# Patient Record
Sex: Female | Born: 1980 | Race: White | Hispanic: No | Marital: Married | Smoking: Never smoker
Health system: Southern US, Community
[De-identification: ages and names within clinical notes are randomized; demographics above are authoritative.]

## PROBLEM LIST (undated history)

## (undated) DIAGNOSIS — D649 Anemia, unspecified: Secondary | ICD-10-CM

## (undated) DIAGNOSIS — I10 Essential (primary) hypertension: Secondary | ICD-10-CM

## (undated) DIAGNOSIS — I471 Supraventricular tachycardia, unspecified: Secondary | ICD-10-CM

## (undated) DIAGNOSIS — M549 Dorsalgia, unspecified: Secondary | ICD-10-CM

## (undated) DIAGNOSIS — D693 Immune thrombocytopenic purpura: Principal | ICD-10-CM

## (undated) DIAGNOSIS — Z9889 Other specified postprocedural states: Secondary | ICD-10-CM

## (undated) DIAGNOSIS — R51 Headache: Secondary | ICD-10-CM

## (undated) DIAGNOSIS — R112 Nausea with vomiting, unspecified: Secondary | ICD-10-CM

## (undated) DIAGNOSIS — O99113 Other diseases of the blood and blood-forming organs and certain disorders involving the immune mechanism complicating pregnancy, third trimester: Principal | ICD-10-CM

## (undated) DIAGNOSIS — G709 Myoneural disorder, unspecified: Secondary | ICD-10-CM

## (undated) HISTORY — DX: Immune thrombocytopenic purpura: D69.3

## (undated) HISTORY — DX: Other diseases of the blood and blood-forming organs and certain disorders involving the immune mechanism complicating pregnancy, third trimester: O99.113

## (undated) HISTORY — PX: WISDOM TOOTH EXTRACTION: SHX21

## (undated) HISTORY — DX: Myoneural disorder, unspecified: G70.9

## (undated) HISTORY — DX: Dorsalgia, unspecified: M54.9

---

## 2012-05-12 LAB — OB RESULTS CONSOLE RUBELLA ANTIBODY, IGM: Rubella: IMMUNE

## 2012-05-12 LAB — OB RESULTS CONSOLE ABO/RH

## 2012-05-12 LAB — OB RESULTS CONSOLE ANTIBODY SCREEN: Antibody Screen: NEGATIVE

## 2012-08-11 ENCOUNTER — Encounter (HOSPITAL_COMMUNITY): Payer: Self-pay

## 2012-08-19 ENCOUNTER — Encounter (HOSPITAL_COMMUNITY): Payer: Self-pay

## 2012-08-20 ENCOUNTER — Encounter (HOSPITAL_COMMUNITY): Payer: Self-pay

## 2012-08-20 ENCOUNTER — Inpatient Hospital Stay (HOSPITAL_COMMUNITY): Admission: RE | Admit: 2012-08-20 | Payer: Medicaid Other | Source: Ambulatory Visit

## 2012-08-21 ENCOUNTER — Encounter (HOSPITAL_COMMUNITY): Payer: Self-pay

## 2012-08-21 ENCOUNTER — Encounter (HOSPITAL_COMMUNITY)
Admission: RE | Admit: 2012-08-21 | Discharge: 2012-08-21 | Disposition: A | Discharge status: Home / Self Care | Payer: Medicaid Other | Source: Ambulatory Visit | Attending: Obstetrics & Gynecology | Admitting: Obstetrics & Gynecology

## 2012-08-21 HISTORY — DX: Headache: R51

## 2012-08-21 HISTORY — DX: Nausea with vomiting, unspecified: R11.2

## 2012-08-21 HISTORY — DX: Anemia, unspecified: D64.9

## 2012-08-21 HISTORY — DX: Other specified postprocedural states: Z98.890

## 2012-08-21 LAB — RPR: RPR Ser Ql: NONREACTIVE

## 2012-08-21 LAB — CBC
Hemoglobin: 14.2 g/dL (ref 12.0–15.0)
MCHC: 34.2 g/dL (ref 30.0–36.0)
WBC: 8.5 10*3/uL (ref 4.0–10.5)

## 2012-08-21 NOTE — Pre-Procedure Instructions (Signed)
Used Jarome Matin from [employer] for translation during [employer].

## 2012-08-21 NOTE — Patient Instructions (Addendum)
   Your procedure is scheduled on:  Monday, Feb 24  Enter through the Main Entrance of [facility] at: 10 am Pick up the phone at the desk and dial [phone] and inform us of your arrival.  Please call this number if you have any problems the morning of surgery: [phone]  Remember: Do not eat food after midnight: Sunday Do not drink clear liquids after: midnight Sunday Take these medicines the morning of surgery with a SIP OF WATER:  None  Do not wear jewelry, make-up, or FINGER nail polish No metal in your hair or on your body. Do not wear lotions, powders, perfumes. You may wear deodorant.  Please use your CHG wash as directed prior to surgery.  Do not shave anywhere for at least 12 hours prior to first CHG shower.  Do not bring valuables to the hospital. Contacts, dentures or bridgework may not be worn into surgery.  Leave suitcase in the car. After Surgery it may be brought to your room. For patients being admitted to the hospital, checkout time is 11:00am the day of discharge.  Home with husband Tarik.

## 2012-08-21 NOTE — Pre-Procedure Instructions (Signed)
Dr Rodman Pickle informed patient's platelets today were 88.  It's noted on the OB Prenatal forms that patient's platelets at 24 weeks were 118.  Per Dr Rodman Pickle, repeat stat cbc on DOS and Type and Crossmatch 2 units stat DOS.  Verified verbal order.

## 2012-08-24 ENCOUNTER — Encounter (HOSPITAL_COMMUNITY)
Admission: AD | Disposition: A | Discharge status: Home / Self Care | Payer: Self-pay | Source: Ambulatory Visit | Attending: Obstetrics & Gynecology

## 2012-08-24 ENCOUNTER — Inpatient Hospital Stay (HOSPITAL_COMMUNITY): Payer: Medicaid Other

## 2012-08-24 ENCOUNTER — Encounter (HOSPITAL_COMMUNITY): Payer: Self-pay

## 2012-08-24 ENCOUNTER — Encounter (HOSPITAL_COMMUNITY): Payer: Self-pay | Admitting: Anesthesiology

## 2012-08-24 ENCOUNTER — Inpatient Hospital Stay (HOSPITAL_COMMUNITY)
Admission: AD | Admit: 2012-08-24 | Discharge: 2012-08-27 | DRG: 766 | Disposition: A | Discharge status: Home / Self Care | Payer: Medicaid Other | Source: Ambulatory Visit | Attending: Obstetrics & Gynecology | Admitting: Obstetrics & Gynecology

## 2012-08-24 ENCOUNTER — Inpatient Hospital Stay (HOSPITAL_COMMUNITY): Payer: Medicaid Other | Admitting: Anesthesiology

## 2012-08-24 DIAGNOSIS — O364XX0 Maternal care for intrauterine death, not applicable or unspecified: Principal | ICD-10-CM | POA: Diagnosis present

## 2012-08-24 DIAGNOSIS — O34219 Maternal care for unspecified type scar from previous cesarean delivery: Secondary | ICD-10-CM | POA: Diagnosis present

## 2012-08-24 LAB — PREPARE RBC (CROSSMATCH)

## 2012-08-24 LAB — CBC
MCH: 29.3 pg (ref 26.0–34.0)
MCV: 87.8 fL (ref 78.0–100.0)
Platelets: 88 10*3/uL — ABNORMAL LOW (ref 150–400)
RBC: 4.58 MIL/uL (ref 3.87–5.11)
RDW: 14.5 % (ref 11.5–15.5)
WBC: 9.9 10*3/uL (ref 4.0–10.5)

## 2012-08-24 SURGERY — Surgical Case
Anesthesia: Spinal | Site: Abdomen | Wound class: Clean Contaminated

## 2012-08-24 MED ORDER — PRENATAL MULTIVITAMIN CH
1.0000 | ORAL_TABLET | Freq: Every day | ORAL | Status: DC
  Administered 2012-08-25 – 2012-08-27 (×3): 1 via ORAL
  Filled 2012-08-24 (×3): qty 1

## 2012-08-24 MED ORDER — ONDANSETRON HCL 4 MG/2ML IJ SOLN: 4.0000 mg | Freq: Once | INTRAMUSCULAR | Status: DC

## 2012-08-24 MED ORDER — NALOXONE HCL 1 MG/ML IJ SOLN: 1.0000 ug/kg/h | INTRAVENOUS | Status: DC | PRN

## 2012-08-24 MED ORDER — DIPHENHYDRAMINE HCL 25 MG PO CAPS: 25.0000 mg | ORAL_CAPSULE | Freq: Four times a day (QID) | ORAL | Status: DC | PRN

## 2012-08-24 MED ORDER — ONDANSETRON HCL 4 MG/2ML IJ SOLN: 4.0000 mg | Freq: Three times a day (TID) | INTRAMUSCULAR | Status: DC | PRN

## 2012-08-24 MED ORDER — IBUPROFEN 600 MG PO TABS: 600.0000 mg | ORAL_TABLET | Freq: Four times a day (QID) | ORAL | Status: DC

## 2012-08-24 MED ORDER — ONDANSETRON HCL 4 MG PO TABS: 4.0000 mg | ORAL_TABLET | ORAL | Status: DC | PRN

## 2012-08-24 MED ORDER — ONDANSETRON HCL 4 MG/2ML IJ SOLN
INTRAMUSCULAR | Status: AC
  Filled 2012-08-24: qty 2

## 2012-08-24 MED ORDER — ONDANSETRON HCL 4 MG/2ML IJ SOLN: 4.0000 mg | INTRAMUSCULAR | Status: DC | PRN

## 2012-08-24 MED ORDER — SIMETHICONE 80 MG PO CHEW
80.0000 mg | CHEWABLE_TABLET | Freq: Three times a day (TID) | ORAL | Status: DC
  Administered 2012-08-24 – 2012-08-27 (×8): 80 mg via ORAL

## 2012-08-24 MED ORDER — METOCLOPRAMIDE HCL 5 MG/ML IJ SOLN: 10.0000 mg | Freq: Three times a day (TID) | INTRAMUSCULAR | Status: DC | PRN

## 2012-08-24 MED ORDER — ACETAMINOPHEN 10 MG/ML IV SOLN
1000.0000 mg | Freq: Four times a day (QID) | INTRAVENOUS | Status: AC | PRN
  Administered 2012-08-24 – 2012-08-25 (×2): 1000 mg via INTRAVENOUS
  Filled 2012-08-24 (×2): qty 100

## 2012-08-24 MED ORDER — PHENYLEPHRINE 40 MCG/ML (10ML) SYRINGE FOR IV PUSH (FOR BLOOD PRESSURE SUPPORT)
PREFILLED_SYRINGE | INTRAVENOUS | Status: AC
  Filled 2012-08-24: qty 15

## 2012-08-24 MED ORDER — FENTANYL CITRATE 0.05 MG/ML IJ SOLN
INTRAMUSCULAR | Status: AC
  Filled 2012-08-24: qty 2

## 2012-08-24 MED ORDER — TETANUS-DIPHTH-ACELL PERTUSSIS 5-2.5-18.5 LF-MCG/0.5 IM SUSP: 0.5000 mL | Freq: Once | INTRAMUSCULAR | Status: DC

## 2012-08-24 MED ORDER — SCOPOLAMINE 1 MG/3DAYS TD PT72: 1.0000 | MEDICATED_PATCH | Freq: Once | TRANSDERMAL | Status: DC

## 2012-08-24 MED ORDER — DIPHENHYDRAMINE HCL 50 MG/ML IJ SOLN: 12.5000 mg | INTRAMUSCULAR | Status: DC | PRN

## 2012-08-24 MED ORDER — OXYTOCIN 10 UNIT/ML IJ SOLN
INTRAMUSCULAR | Status: AC
  Filled 2012-08-24: qty 4

## 2012-08-24 MED ORDER — DIBUCAINE 1 % RE OINT: 1.0000 "application " | TOPICAL_OINTMENT | RECTAL | Status: DC | PRN

## 2012-08-24 MED ORDER — SCOPOLAMINE 1 MG/3DAYS TD PT72
MEDICATED_PATCH | TRANSDERMAL | Status: AC
  Administered 2012-08-24: 1.5 mg
  Filled 2012-08-24: qty 1

## 2012-08-24 MED ORDER — SENNOSIDES-DOCUSATE SODIUM 8.6-50 MG PO TABS
2.0000 | ORAL_TABLET | Freq: Every day | ORAL | Status: DC
  Administered 2012-08-24 – 2012-08-26 (×3): 2 via ORAL

## 2012-08-24 MED ORDER — LACTATED RINGERS IV SOLN
INTRAVENOUS | Status: DC
  Administered 2012-08-24 – 2012-08-25 (×2): via INTRAVENOUS

## 2012-08-24 MED ORDER — OXYTOCIN 10 UNIT/ML IJ SOLN
40.0000 [IU] | INTRAVENOUS | Status: DC | PRN
  Administered 2012-08-24: 40 [IU] via INTRAVENOUS

## 2012-08-24 MED ORDER — OXYTOCIN 40 UNITS IN LACTATED RINGERS INFUSION - SIMPLE MED: 62.5000 mL/h | INTRAVENOUS | Status: AC

## 2012-08-24 MED ORDER — SODIUM CHLORIDE 0.9 % IJ SOLN: 3.0000 mL | INTRAMUSCULAR | Status: DC | PRN

## 2012-08-24 MED ORDER — LACTATED RINGERS IV SOLN
INTRAVENOUS | Status: DC
  Administered 2012-08-24 (×3): via INTRAVENOUS

## 2012-08-24 MED ORDER — MENTHOL 3 MG MT LOZG
1.0000 | LOZENGE | OROMUCOSAL | Status: DC | PRN
  Filled 2012-08-24: qty 9

## 2012-08-24 MED ORDER — SIMETHICONE 80 MG PO CHEW: 80.0000 mg | CHEWABLE_TABLET | ORAL | Status: DC | PRN

## 2012-08-24 MED ORDER — CEFAZOLIN SODIUM-DEXTROSE 2-3 GM-% IV SOLR
2.0000 g | INTRAVENOUS | Status: AC
  Administered 2012-08-24: 2 g via INTRAVENOUS

## 2012-08-24 MED ORDER — LANOLIN HYDROUS EX OINT: 1.0000 "application " | TOPICAL_OINTMENT | CUTANEOUS | Status: DC | PRN

## 2012-08-24 MED ORDER — ZOLPIDEM TARTRATE 5 MG PO TABS
5.0000 mg | ORAL_TABLET | Freq: Every evening | ORAL | Status: DC | PRN
  Administered 2012-08-25 – 2012-08-26 (×2): 5 mg via ORAL
  Filled 2012-08-24 (×2): qty 1

## 2012-08-24 MED ORDER — MORPHINE SULFATE 0.5 MG/ML IJ SOLN
INTRAMUSCULAR | Status: AC
  Filled 2012-08-24: qty 10

## 2012-08-24 MED ORDER — PROMETHAZINE HCL 25 MG/ML IJ SOLN: 6.2500 mg | INTRAMUSCULAR | Status: DC | PRN

## 2012-08-24 MED ORDER — NALBUPHINE HCL 10 MG/ML IJ SOLN: 5.0000 mg | INTRAMUSCULAR | Status: DC | PRN

## 2012-08-24 MED ORDER — MEPERIDINE HCL 25 MG/ML IJ SOLN: 6.2500 mg | INTRAMUSCULAR | Status: DC | PRN

## 2012-08-24 MED ORDER — INFLUENZA VIRUS VACC SPLIT PF IM SUSP: 0.5000 mL | INTRAMUSCULAR | Status: AC

## 2012-08-24 MED ORDER — BUPIVACAINE IN DEXTROSE 0.75-8.25 % IT SOLN
INTRATHECAL | Status: DC | PRN
  Administered 2012-08-24: 1.6 mL via INTRATHECAL

## 2012-08-24 MED ORDER — OXYCODONE-ACETAMINOPHEN 5-325 MG PO TABS
1.0000 | ORAL_TABLET | ORAL | Status: DC | PRN
  Administered 2012-08-25 – 2012-08-27 (×11): 2 via ORAL
  Filled 2012-08-24 (×11): qty 2

## 2012-08-24 MED ORDER — FENTANYL CITRATE 0.05 MG/ML IJ SOLN
INTRAMUSCULAR | Status: DC | PRN
  Administered 2012-08-24: 25 ug via INTRATHECAL

## 2012-08-24 MED ORDER — DIPHENHYDRAMINE HCL 25 MG PO CAPS: 25.0000 mg | ORAL_CAPSULE | ORAL | Status: DC | PRN

## 2012-08-24 MED ORDER — FENTANYL CITRATE 0.05 MG/ML IJ SOLN: 25.0000 ug | INTRAMUSCULAR | Status: DC | PRN

## 2012-08-24 MED ORDER — MIDAZOLAM HCL 2 MG/2ML IJ SOLN: 0.5000 mg | Freq: Once | INTRAMUSCULAR | Status: AC | PRN

## 2012-08-24 MED ORDER — CEFAZOLIN SODIUM-DEXTROSE 2-3 GM-% IV SOLR
INTRAVENOUS | Status: AC
  Filled 2012-08-24: qty 50

## 2012-08-24 MED ORDER — PHENYLEPHRINE HCL 10 MG/ML IJ SOLN
INTRAMUSCULAR | Status: DC | PRN
  Administered 2012-08-24: 40 ug via INTRAVENOUS
  Administered 2012-08-24 (×3): 80 ug via INTRAVENOUS

## 2012-08-24 MED ORDER — DIPHENHYDRAMINE HCL 50 MG/ML IJ SOLN: 25.0000 mg | INTRAMUSCULAR | Status: DC | PRN

## 2012-08-24 MED ORDER — MORPHINE SULFATE (PF) 0.5 MG/ML IJ SOLN
INTRAMUSCULAR | Status: DC | PRN
  Administered 2012-08-24: .1 mg via INTRATHECAL

## 2012-08-24 MED ORDER — WITCH HAZEL-GLYCERIN EX PADS: 1.0000 "application " | MEDICATED_PAD | CUTANEOUS | Status: DC | PRN

## 2012-08-24 MED ORDER — NALOXONE HCL 0.4 MG/ML IJ SOLN: 0.4000 mg | INTRAMUSCULAR | Status: DC | PRN

## 2012-08-24 SURGICAL SUPPLY — 25 items
CLOTH BEACON ORANGE TIMEOUT ST (SAFETY) ×2 IMPLANT
DRAPE LG THREE QUARTER DISP (DRAPES) ×2 IMPLANT
DRSG OPSITE POSTOP 4X10 (GAUZE/BANDAGES/DRESSINGS) ×2 IMPLANT
DURAPREP 26ML APPLICATOR (WOUND CARE) ×2 IMPLANT
ELECT REM PT RETURN 9FT ADLT (ELECTROSURGICAL) ×2
ELECTRODE REM PT RTRN 9FT ADLT (ELECTROSURGICAL) ×1 IMPLANT
GLOVE BIOGEL PI IND STRL 7.0 (GLOVE) ×2 IMPLANT
GLOVE BIOGEL PI INDICATOR 7.0 (GLOVE) ×2
GLOVE ECLIPSE 6.0 STRL STRAW (GLOVE) ×2 IMPLANT
GLOVE ECLIPSE 6.5 STRL STRAW (GLOVE) ×2 IMPLANT
GOWN PREVENTION PLUS LG XLONG (DISPOSABLE) ×6 IMPLANT
NEEDLE HYPO 25X5/8 SAFETYGLIDE (NEEDLE) ×2 IMPLANT
NS IRRIG 1000ML POUR BTL (IV SOLUTION) ×2 IMPLANT
PACK C SECTION WH (CUSTOM PROCEDURE TRAY) ×2 IMPLANT
PAD OB MATERNITY 4.3X12.25 (PERSONAL CARE ITEMS) ×2 IMPLANT
RTRCTR C-SECT PINK 25CM LRG (MISCELLANEOUS) ×2 IMPLANT
STAPLER VISISTAT 35W (STAPLE) ×2 IMPLANT
SUT VIC AB 0 CT1 27 (SUTURE) ×3
SUT VIC AB 0 CT1 27XBRD ANBCTR (SUTURE) ×3 IMPLANT
SUT VIC AB 1 CTX 36 (SUTURE) ×2
SUT VIC AB 1 CTX36XBRD ANBCTRL (SUTURE) ×2 IMPLANT
SUT VIC AB 3-0 CT1 27 (SUTURE) ×1
SUT VIC AB 3-0 CT1 TAPERPNT 27 (SUTURE) ×1 IMPLANT
TOWEL OR 17X24 6PK STRL BLUE (TOWEL DISPOSABLE) ×6 IMPLANT
TRAY FOLEY CATH 14FR (SET/KITS/TRAYS/PACK) ×2 IMPLANT

## 2012-08-24 NOTE — Op Note (Addendum)
Patient Name: Ebony [name] MRN: [id number]  Date of Surgery: 08/24/2012    PREOPERATIVE DIAGNOSIS: Repeat cesarean, Fetal Demise  POSTOPERATIVE DIAGNOSIS: Repeat; Fetal Demise   PROCEDURE: Low transverse cesarean section  SURGEON: Caralyn Guile. Arlyce Dice M.D.  ASSISTANT: Philip Aspen, D.O.  ANESTHESIA: Spinal  ESTIMATED BLOOD LOSS: 800 ml  FINDINGS: Stillborn female, 8 lbs 10 oz.  Thick meconium staining.  Single loose nuchal cord.   INDICATIONS: This is a 32 y.o.  [place] 1 who is admitted for Repeat cesarean delivery.  In pre op she was found to have a fetal demise confirmed by ultrasound.  Patient declined TOLAC.  PROCEDURE IN DETAIL: The patient was taken to the operating room and spinal anesthesia was placed.  She was then placed in the supine position with left lateral displacement of the uterus. The abdomen was prepped and draped in a sterile fashion and the bladder was catheterized.  A low transverse abdominal incision was made and carried down to the fascia. The fascia was opened transversely and the rectus sheath was dissected from the underlying rectus muscle. The rectus midline was identified and opened by sharp and blunt dissection. The peritoneum was opened. An Alexis retractor was placed and the lower uterine segment was identified, entered transversely by careful sharp dissection, and extended bluntly.  The infant was delivered without difficulty. The placenta was sent to pathology. The uterus was bluntly curettage. The lower segment was closed with running interlocking Vicryl 1 suture. The peritoneum and rectus muscle were closed in the midline with running 3-0 Vicryl suture. The fascia was closed with running 0 Vicryl suture and the skin was closed with staples. All sponge and instrument counts were correct.  The patient tolerated the procedure well and left the operating room in good condition.

## 2012-08-24 NOTE — Progress Notes (Signed)
I have interviewed and performed the pertinent exams on my patient.  Unfortunately, she has had a fetal demise.  This was confirmed with ultrasound.  After allowing enough time for the parents to absorb this sad news, they were offered TOLAC.  They decided to proceed with repeat cesarean due to the small but real risk of uterine rupture.

## 2012-08-24 NOTE — Anesthesia Preprocedure Evaluation (Addendum)
Anesthesia Evaluation  Patient identified by MRN, date of birth, ID band Patient awake    Reviewed: Allergy & Precautions, H&P , NPO status , Patient's Chart, lab work & pertinent test results  History of Anesthesia Complications (+) PONV  Airway Mallampati: II      Dental no notable dental hx.    Pulmonary neg pulmonary ROS,  breath sounds clear to auscultation  Pulmonary exam normal       Cardiovascular Exercise Tolerance: Good negative cardio ROS  Rhythm:regular Rate:Normal     Neuro/Psych  Headaches, negative neurological ROS  negative psych ROS   GI/Hepatic negative GI ROS, Neg liver ROS,   Endo/Other  negative endocrine ROS  Renal/GU negative Renal ROS  negative genitourinary   Musculoskeletal   Abdominal Normal abdominal exam  (+)   Peds  Hematology negative hematology ROS (+) anemia ,   Anesthesia Other Findings PONV (postoperative nausea and vomiting)     Anemia   history    Headache   otc med prn    Reproductive/Obstetrics (+) Pregnancy                           Anesthesia Physical Anesthesia Plan  ASA: II  Anesthesia Plan: Spinal   Post-op Pain Management:    Induction:   Airway Management Planned:   Additional Equipment:   Intra-op Plan:   Post-operative Plan:   Informed Consent: I have reviewed the patients History and Physical, chart, labs and discussed the procedure including the risks, benefits and alternatives for the proposed anesthesia with the patient or authorized representative who has indicated his/her understanding and acceptance.     Plan Discussed with: Anesthesiologist, CRNA and Surgeon  Anesthesia Plan Comments:        low platelets.  Will recheck. Used interpretor Anesthesia Quick Evaluation

## 2012-08-24 NOTE — Transfer of Care (Signed)
Immediate Anesthesia Transfer of Care Note  Patient: [name]  Procedure(s) Performed: Procedure(s): CESAREAN SECTION (N/A)  Patient Location: PACU  Anesthesia Type:Spinal  Level of Consciousness: awake, alert  and oriented  Airway & Oxygen Therapy: Patient Spontanous Breathing  Post-op Assessment: Report given to PACU RN and Post -op Vital signs reviewed and stable  Post vital signs: stable  Complications: No apparent anesthesia complications

## 2012-08-24 NOTE — Anesthesia Postprocedure Evaluation (Signed)
  Anesthesia Post Note  Patient: Ebony [name]  Procedure(s) Performed: Procedure(s) (LRB): CESAREAN SECTION (N/A)  Anesthesia type: Spinal  Patient location: PACU  Post pain: Pain level controlled  Post assessment: Post-op Vital signs reviewed  Last Vitals:  Filed Vitals:   08/24/12 1552  BP: 112/58  Pulse: 76  Temp: 36.8 C  Resp: 16    Post vital signs: Reviewed  Level of consciousness: awake  Complications: No apparent anesthesia complications

## 2012-08-24 NOTE — Anesthesia Procedure Notes (Signed)
Spinal  Patient location during procedure: OR Start time: 08/24/2012 1:39 PM Staffing Anesthesiologist: Angus Seller., Harrell Gave. Performed by: anesthesiologist  Preanesthetic Checklist Completed: patient identified, site marked, surgical consent, pre-op evaluation, timeout performed, IV checked, risks and benefits discussed and monitors and equipment checked Spinal Block Patient position: sitting Prep: DuraPrep Patient monitoring: heart rate, cardiac monitor, continuous pulse ox and blood pressure Approach: midline Location: L3-4 Injection technique: single-shot Needle Needle type: Sprotte  Needle gauge: 24 G Needle length: 9 cm Assessment Sensory level: T4 Additional Notes Patient identified.  Risk benefits discussed including failed block, incomplete pain control, headache, nerve damage, paralysis, blood pressure changes, nausea, vomiting, reactions to medication both toxic or allergic, and postpartum back pain.  Patient expressed understanding and wished to proceed.  All questions were answered.  Sterile technique used throughout procedure.  CSF was clear.  No parasthesia or other complications.  Please see nursing notes for vital signs.  first attempt.  No redirection needed.

## 2012-08-24 NOTE — Progress Notes (Signed)
Unable to hear fetal heartones via doppler.. Dr. Arlyce Dice aware and on way to check.

## 2012-08-24 NOTE — Progress Notes (Signed)
Patient resting comfortably.  Sat with parents for 15 minutes to discuss possible reasons and to reassure them that it was not due to anything she had done (they asked me if drinking coffee, lifting, anxiety, etc may have caused the demise).  I explained that most likely we would never find a cause other than unexplained cardiac arrest.  I have ordered a hemoglobin A1c and a lupus anticoagulant panel for the morning.  We will await these results and the pathology on the placenta to see if any etiologies may be ascertained.

## 2012-08-25 ENCOUNTER — Encounter (HOSPITAL_COMMUNITY): Payer: Self-pay | Admitting: Obstetrics & Gynecology

## 2012-08-25 LAB — TYPE AND SCREEN
ABO/RH(D): O POS
Unit division: 0

## 2012-08-25 LAB — CBC
Hemoglobin: 12.6 g/dL (ref 12.0–15.0)
MCH: 29.3 pg (ref 26.0–34.0)
RBC: 4.3 MIL/uL (ref 3.87–5.11)

## 2012-08-25 LAB — HEMOGLOBIN A1C
Hgb A1c MFr Bld: 5.6 % (ref <5.7)
Mean Plasma Glucose: 114 mg/dL (ref <117)

## 2012-08-25 NOTE — H&P (Signed)
32 y.o. G2P1  Estimated Date of Delivery: 08/30/12 admitted at 39/[redacted] weeks gestation with fetal demise.  Patient presented to pre-op area for scheduled repeat cesarean delivery.  Routine pre-op assessment of fetus revealed no fetal heart tones.  Bedside ultrasound confirmed fetal demise.  Patient states that after her routine office visit on Feb. 21, she did not note fetal movement since Feb. 22.  Prenatal Transfer Tool  Maternal Diabetes: No Genetic Screening: Not done due to late prenatal care - first visit 24 weeks. Maternal Ultrasounds/Referrals: Normal Fetal Ultrasounds or other Referrals:  None Maternal Substance Abuse:  No Significant Maternal Medications:  None Significant Maternal Lab Results: None Other Significant Pregnancy Complications:  None until today.  Afebrile, VSS Heart and Lungs: No active disease Abdomen: soft, gravid, EFW AGA. Cervical exam:  1/50.  Impression: Discussed option of TOLAC in view of fetal demise.  Patient requested that we proceed with repeat cesarean delivery  Plan:  Cesarean delivery

## 2012-08-25 NOTE — Progress Notes (Signed)
  Patient is eating, ambulating, voiding.  Pain control is good.  Filed Vitals:   08/24/12 2030 08/24/12 2201 08/25/12 0025 08/25/12 0557  BP: 107/67 101/62 103/49 99/65  Pulse: 67 66 74 79  Temp: 98.2 F (36.8 C) 97.5 F (36.4 C) 98.3 F (36.8 C) 98.8 F (37.1 C)  TempSrc: Oral Oral Oral Oral  Resp: 16 16 16 17   Height:      Weight:      SpO2: 96% 96% 96% 96%    lungs:   clear to auscultation cor:    RRR Abdomen:  soft, appropriate tenderness, incisions intact and without erythema or exudate ex:    no cords   Lab Results  Component Value Date   WBC 10.2 08/25/2012   HGB 12.6 08/25/2012   HCT 37.7 08/25/2012   MCV 87.7 08/25/2012   PLT 94* 08/25/2012    --/--/O POS, O POS (02/21 1030)/RI  A/P    Post operative day 1 repeat c/s after IUFD.  Routine post op and postpartum care.  Expect d/c tomorrow.  Percocet for pain control.

## 2012-08-25 NOTE — Anesthesia Postprocedure Evaluation (Signed)
  Anesthesia Post-op Note  Patient: MWNUUV Colombe  Procedure(s) Performed: Procedure(s): CESAREAN SECTION (N/A)  Patient Location: PACU and Women's Unit  Anesthesia Type:Spinal  Level of Consciousness: awake, alert  and oriented  Airway and Oxygen Therapy: Patient Spontanous Breathing  Post-op Pain: moderate  Post-op Assessment: Patient's Cardiovascular Status Stable, Respiratory Function Stable, No signs of Nausea or vomiting, Adequate PO intake, No headache, No backache, No residual numbness and No residual motor weakness  Post-op Vital Signs: stable  Complications: No apparent anesthesia complications

## 2012-08-26 LAB — LUPUS ANTICOAGULANT PANEL

## 2012-08-26 MED ORDER — BUTORPHANOL TARTRATE 1 MG/ML IJ SOLN
2.0000 mg | Freq: Once | INTRAMUSCULAR | Status: AC
  Administered 2012-08-26: 2 mg via INTRAVENOUS
  Filled 2012-08-26: qty 2

## 2012-08-26 NOTE — Progress Notes (Signed)
08/26/12 1400  Clinical Encounter Type  Visited With Patient  Visit Type Spiritual support;Social support (Bereavement care)  Referral From Chaplain;Nurse  Spiritual Encounters  Spiritual Needs Emotional;Grief support (New to this area; has 35.34-year-old at home)   Spiritual Care has followed Ebony [name] from a distance since she arrived in Short Stay; this was the first time that a spiritual care visit was possible and well-timed for her.  She opened up with me, sharing about her life, family, and shock/grief.  Ebony [name] is from [place], speaks English as her fourth language, has a degree from [place] in sociology and communications, is married and has a son who is 3.5, and is involved with a local mosque (which performed prayers and burial for her baby girl).  She has lived in the [place] for five years, most of which she spent in [place]; she has been in this area for ca 7 months and has some local friends.  She and her husband do not have local family; their extended families are in [place].  We talked about grief, sharing, human connection, heartache, healing, and the struggle with not having any answer to the "Why?" question.  Ebony [name] told me that several staff members have checked on her and offered opportunity to talk about her feelings.  Apparently I came at just the right time because she said, "You are the first person I have talked to and will be the last" [meaning, I think, for now--here at the hospital].  This was the window she needed in which to share her story.   I gave her my card, information about our Comfort Program, and a [facility] brochure, encouraging her to call anytime for further support.  She was very Adult nurse.  [address] [place], [place] [phone]

## 2012-08-27 MED ORDER — OXYCODONE-ACETAMINOPHEN 5-325 MG PO TABS: 1.0000 | ORAL_TABLET | ORAL | Status: DC | PRN

## 2012-08-27 NOTE — Progress Notes (Signed)
POD#3 Pt is doing well. Appropriate response to demise. Ready for discharge. VSSAF Incision- is healing well IMP/ stable PLAN/ will discharge.

## 2012-08-27 NOTE — Progress Notes (Signed)
Pt discharged to home with husband.  Condition stable.  Pt to car via wheelchair with RN.  No equipment for home ordered at discharge.  Pt tearful, verbalizing sadness and shock regarding loss of baby. She states it is difficult for her to leave the hospital without her baby.  RN provided listening, acknowledgement of pt's feelings, and encouragement regarding returning home to 34 yo son.  Pt has grief support group information and states that she will think about contacting them.

## 2012-08-30 NOTE — Discharge Summary (Signed)
Ebony [name], Ebony [name]             ACCOUNT NO.:  [id number]  MEDICAL RECORD NO.:  [id number]  LOCATION:  9311                          FACILITY:  [facility]  PHYSICIAN:  Malva Limes, M.D.    DATE OF BIRTH:  May 18, 1981  DATE OF ADMISSION:  08/24/2012 DATE OF DISCHARGE:  08/27/2012                              DISCHARGE SUMMARY   PRIMARY DISCHARGE DIAGNOSES: 1. Intrauterine pregnancy at term. 2. Fetal demise. 3. History of prior low transverse cesarean section. 4. The patient declines a trial of labor after cesarean section.  PRINCIPAL PROCEDURE:  Repeat cesarean section.  HISTORY OF PRESENT ILLNESS:  [name] is a 32 year old, white female, G2, P1 at term who presented to the hospital for a scheduled repeat cesarean section.  On arrival, the patient was noted to have no fetal heart rate tones.  An ultrasound was performed, which confirmed the fetal demise.  The patient was offered a trial of labor and declined, therefore, she was taken to the operating room by Dr. Ilda Mori for repeat cesarean section.  Please see the dictated note for complete description of this.  The patient delivered one stillborn female weighing 8 pounds and 10 ounces.  It was noted that there was thick meconium and 1 single loose nuchal cord.  No other obvious etiologies for the fetal demise could be identified.  During the postop period, the patient did have a hemoglobin A1c and a lupus anticoagulant panel obtained.  The placenta was also sent to pathology.  The patient's postoperative course was uncomplicated except for the grieving process. The patient was discharged home on postoperative day #3.  At that time, she was eating a regular diet.  She was ambulating without difficulty. Her incision appeared to be healing well.  Uterus was nontender.  The patient was discharged home with Percocet.  She was instructed to follow up in the office in 2 weeks.  The labs will follow up in the office.     ______________________________ Malva Limes, M.D.     MA/MEDQ  D:  08/30/2012  T:  08/30/2012  Job:  [id number]

## 2014-02-01 ENCOUNTER — Other Ambulatory Visit: Payer: Self-pay | Admitting: Obstetrics and Gynecology

## 2014-02-01 LAB — OB RESULTS CONSOLE ABO/RH: RH TYPE: POSITIVE

## 2014-02-01 LAB — OB RESULTS CONSOLE GC/CHLAMYDIA
CHLAMYDIA, DNA PROBE: NEGATIVE
GC PROBE AMP, GENITAL: NEGATIVE

## 2014-02-01 LAB — OB RESULTS CONSOLE HEPATITIS B SURFACE ANTIGEN: Hepatitis B Surface Ag: NEGATIVE

## 2014-02-01 LAB — OB RESULTS CONSOLE RPR: RPR: NONREACTIVE

## 2014-02-01 LAB — OB RESULTS CONSOLE RUBELLA ANTIBODY, IGM: Rubella: IMMUNE

## 2014-02-01 LAB — OB RESULTS CONSOLE HIV ANTIBODY (ROUTINE TESTING): HIV: NONREACTIVE

## 2014-02-01 LAB — OB RESULTS CONSOLE ANTIBODY SCREEN: ANTIBODY SCREEN: NEGATIVE

## 2014-02-02 LAB — CYTOLOGY - PAP

## 2014-05-02 ENCOUNTER — Encounter (HOSPITAL_COMMUNITY): Payer: Self-pay | Admitting: Obstetrics & Gynecology

## 2014-05-18 ENCOUNTER — Encounter (HOSPITAL_COMMUNITY): Payer: Self-pay | Admitting: *Deleted

## 2014-05-18 ENCOUNTER — Inpatient Hospital Stay (HOSPITAL_COMMUNITY)
Admission: AD | Admit: 2014-05-18 | Discharge: 2014-05-18 | Disposition: A | Discharge status: Home / Self Care | Payer: Medicaid Other | Source: Ambulatory Visit | Attending: Internal Medicine | Admitting: Internal Medicine

## 2014-05-18 DIAGNOSIS — O26892 Other specified pregnancy related conditions, second trimester: Secondary | ICD-10-CM | POA: Insufficient documentation

## 2014-05-18 DIAGNOSIS — O99332 Smoking (tobacco) complicating pregnancy, second trimester: Secondary | ICD-10-CM | POA: Diagnosis present

## 2014-05-18 DIAGNOSIS — O9989 Other specified diseases and conditions complicating pregnancy, childbirth and the puerperium: Secondary | ICD-10-CM

## 2014-05-18 DIAGNOSIS — R109 Unspecified abdominal pain: Secondary | ICD-10-CM | POA: Insufficient documentation

## 2014-05-18 DIAGNOSIS — O26899 Other specified pregnancy related conditions, unspecified trimester: Secondary | ICD-10-CM

## 2014-05-18 DIAGNOSIS — Z3A27 27 weeks gestation of pregnancy: Secondary | ICD-10-CM | POA: Diagnosis not present

## 2014-05-18 DIAGNOSIS — N390 Urinary tract infection, site not specified: Secondary | ICD-10-CM

## 2014-05-18 LAB — URINALYSIS, ROUTINE W REFLEX MICROSCOPIC
BILIRUBIN URINE: NEGATIVE
GLUCOSE, UA: 100 mg/dL — AB
Ketones, ur: NEGATIVE mg/dL
NITRITE: NEGATIVE
PH: 6 (ref 5.0–8.0)
Protein, ur: NEGATIVE mg/dL
SPECIFIC GRAVITY, URINE: 1.02 (ref 1.005–1.030)
Urobilinogen, UA: 0.2 mg/dL (ref 0.0–1.0)

## 2014-05-18 LAB — WET PREP, GENITAL
CLUE CELLS WET PREP: NONE SEEN
TRICH WET PREP: NONE SEEN
YEAST WET PREP: NONE SEEN

## 2014-05-18 LAB — URINE MICROSCOPIC-ADD ON

## 2014-05-18 MED ORDER — NITROFURANTOIN MONOHYD MACRO 100 MG PO CAPS: 100.0000 mg | ORAL_CAPSULE | Freq: Two times a day (BID) | ORAL | Status: DC

## 2014-05-18 NOTE — Discharge Instructions (Signed)

## 2014-05-18 NOTE — MAU Provider Note (Signed)
History     CSN: [id number]  Arrival date and time: 05/18/14 0905   First Provider Initiated Contact with Patient 05/18/14 0957      Chief Complaint  Patient presents with  . Abdominal Pain   HPI   Ebony [name] is a 33 y.o. female G3P2001 at [id number] who present with abdominal pain; pain in the middle- lower part of her abdomen. The pain is there all the time however worse when she is laying down.  The pain is similar to the pain she had with her period. She feels like she is going to start her period. She has a history of a term IUFD and is very nervous about the well being of her baby.   OB History    Gravida Para Term Preterm AB TAB SAB Ectopic Multiple Living   3 2 2       1       Past Medical History  Diagnosis Date  . PONV (postoperative nausea and vomiting)   . Anemia     history  . Headache(784.0)     otc med prn    Past Surgical History  Procedure Laterality Date  . Cesarean section  12/2008  . Wisdom tooth extraction    . Cesarean section N/A 08/24/2012    Procedure: CESAREAN SECTION;  Surgeon: Mickel Baasichard D Kaplan, MD;  Location: [facility] ORS;  Service: Obstetrics;  Laterality: N/A;    History reviewed. No pertinent family history.  History  Substance Use Topics  . Smoking status: Never Smoker   . Smokeless tobacco: Never Used  . Alcohol Use: No    Allergies: No Known Allergies  Prescriptions prior to admission  Medication Sig Dispense Refill Last Dose  . acetaminophen (TYLENOL) 325 MG tablet Take 650 mg by mouth daily as needed for pain (headache).   Past Week at Unknown time  . Prenatal Vit-Fe Fumarate-FA (PRENATAL MULTIVITAMIN) TABS tablet Take 1 tablet by mouth daily at 12 noon.   05/17/2014 at Unknown time  . oxyCODONE-acetaminophen (PERCOCET/ROXICET) 5-325 MG per tablet Take 1-2 tablets by mouth every 4 (four) hours as needed. (Patient not taking: Reported on 05/18/2014) 30 tablet 0      Results for orders placed or performed during the hospital  encounter of 05/18/14 (from the past 48 hour(s))  Urinalysis, Routine w reflex microscopic     Status: Abnormal   Collection Time: 05/18/14  9:08 AM  Result Value Ref Range   Color, Urine YELLOW YELLOW   APPearance CLOUDY (A) CLEAR   Specific Gravity, Urine 1.020 1.005 - 1.030   pH 6.0 5.0 - 8.0   Glucose, UA 100 (A) NEGATIVE mg/dL   Hgb urine dipstick LARGE (A) NEGATIVE   Bilirubin Urine NEGATIVE NEGATIVE   Ketones, ur NEGATIVE NEGATIVE mg/dL   Protein, ur NEGATIVE NEGATIVE mg/dL   Urobilinogen, UA 0.2 0.0 - 1.0 mg/dL   Nitrite NEGATIVE NEGATIVE   Leukocytes, UA SMALL (A) NEGATIVE  Urine microscopic-add on     Status: Abnormal   Collection Time: 05/18/14  9:08 AM  Result Value Ref Range   Squamous Epithelial / LPF FEW (A) RARE   RBC / HPF 21-50 <3 RBC/hpf   Bacteria, UA RARE RARE  Wet prep, genital     Status: Abnormal   Collection Time: 05/18/14 10:05 AM  Result Value Ref Range   Yeast Wet Prep HPF POC NONE SEEN NONE SEEN   Trich, Wet Prep NONE SEEN NONE SEEN   Clue Cells Wet Prep HPF POC  NONE SEEN NONE SEEN   WBC, Wet Prep HPF POC FEW (A) NONE SEEN    Comment: FEW BACTERIA SEEN   Review of Systems  Constitutional: Negative for fever and chills.  Gastrointestinal: Positive for abdominal pain. Negative for nausea, vomiting, diarrhea and constipation.  Genitourinary: Negative for dysuria and hematuria.  Musculoskeletal: Positive for back pain (Middle of her back. ).   Physical Exam   Blood pressure 118/70, pulse 81, temperature 98 F (36.7 C), temperature source Oral, resp. rate 16.  Physical Exam  Constitutional: She is oriented to person, place, and time. She appears well-developed and well-nourished. No distress.  HENT:  Head: Normocephalic.  Eyes: Pupils are equal, round, and reactive to light.  Neck: Neck supple.  Respiratory: Effort normal.  GI: Soft. Normal appearance. There is tenderness in the suprapubic area. There is rigidity. There is no rebound and no  guarding.  Genitourinary:  Speculum exam: Vagina - Small amount of creamy discharge, no odor Cervix - No contact bleeding Bimanual exam: Dilation: Closed Effacement (%): Thick Station:  (high) Exam by:: Jerrye BushyJ Rausch NP Chaperone present for exam.   Musculoskeletal: Normal range of motion.  Neurological: She is alert and oriented to person, place, and time.  Skin: Skin is warm. She is not diaphoretic.  Psychiatric: Her behavior is normal.    Fetal Tracing: Baseline: 125 bpm  Variability: Moderate  Accelerations: 15x15 Decelerations: None Toco: none   MAU Course  Procedures  None  MDM UA Urine culture  1030: consulted with Dr. Claiborne Billingsallahan.Discussed NST, urine results, physical exam and cervical exam.   Assessment and Plan   A: 1. Abdominal pain in pregnancy   2. UTI (lower urinary tract infection)     P: Discharge home in stable condition RX: Macrobid Return to MAU if symptoms worsen Follow up with PCP as scheduled. Urine culture pending   Iona HansenJennifer Irene Fraida Veldman, NP 05/18/2014 1:05 PM

## 2014-05-18 NOTE — MAU Note (Signed)
C/o lower, mid-pelvic pain since yesterday afternoon;

## 2014-05-19 LAB — CULTURE, OB URINE
Colony Count: 10000
Special Requests: NORMAL

## 2014-06-03 ENCOUNTER — Telehealth: Payer: Self-pay | Admitting: Hematology and Oncology

## 2014-06-03 NOTE — Telephone Encounter (Signed)
S/W PATIENT AND GAVE NP APPT FOR 12/15 @ 2:15 W[facility]  WELCOME PACKET MAILED [employer]CALENDAR

## 2014-06-14 ENCOUNTER — Ambulatory Visit (HOSPITAL_BASED_OUTPATIENT_CLINIC_OR_DEPARTMENT_OTHER): Payer: Medicaid Other

## 2014-06-14 ENCOUNTER — Encounter: Payer: Self-pay | Admitting: Hematology and Oncology

## 2014-06-14 ENCOUNTER — Ambulatory Visit (HOSPITAL_BASED_OUTPATIENT_CLINIC_OR_DEPARTMENT_OTHER): Payer: Medicaid Other | Admitting: Hematology and Oncology

## 2014-06-14 ENCOUNTER — Telehealth: Payer: Self-pay | Admitting: Hematology and Oncology

## 2014-06-14 ENCOUNTER — Ambulatory Visit: Payer: Medicaid Other

## 2014-06-14 VITALS — BP 115/64 | HR 102 | Temp 98.6°F | Resp 20 | Ht 65.0 in | Wt 195.8 lb

## 2014-06-14 DIAGNOSIS — D693 Immune thrombocytopenic purpura: Secondary | ICD-10-CM

## 2014-06-14 DIAGNOSIS — O99113 Other diseases of the blood and blood-forming organs and certain disorders involving the immune mechanism complicating pregnancy, third trimester: Secondary | ICD-10-CM

## 2014-06-14 DIAGNOSIS — D696 Thrombocytopenia, unspecified: Secondary | ICD-10-CM

## 2014-06-14 HISTORY — DX: Immune thrombocytopenic purpura: D69.3

## 2014-06-14 LAB — CBC WITH DIFFERENTIAL/PLATELET
BASO%: 0.1 % (ref 0.0–2.0)
Basophils Absolute: 0 10*3/uL (ref 0.0–0.1)
EOS%: 0.6 % (ref 0.0–7.0)
Eosinophils Absolute: 0.1 10*3/uL (ref 0.0–0.5)
HCT: 39.8 % (ref 34.8–46.6)
HGB: 13.5 g/dL (ref 11.6–15.9)
LYMPH#: 1.4 10*3/uL (ref 0.9–3.3)
LYMPH%: 15.1 % (ref 14.0–49.7)
MCH: 31.5 pg (ref 25.1–34.0)
MCHC: 33.9 g/dL (ref 31.5–36.0)
MCV: 93 fL (ref 79.5–101.0)
MONO#: 0.5 10*3/uL (ref 0.1–0.9)
MONO%: 6 % (ref 0.0–14.0)
NEUT#: 7 10*3/uL — ABNORMAL HIGH (ref 1.5–6.5)
NEUT%: 78.2 % — ABNORMAL HIGH (ref 38.4–76.8)
NRBC: 0 % (ref 0–0)
Platelets: 102 10*3/uL — ABNORMAL LOW (ref 145–400)
RBC: 4.28 10*6/uL (ref 3.70–5.45)
RDW: 13.8 % (ref 11.2–14.5)
WBC: 9 10*3/uL (ref 3.9–10.3)

## 2014-06-14 MED ORDER — PREDNISONE 20 MG PO TABS: 60.0000 mg | ORAL_TABLET | Freq: Every day | ORAL | Status: DC

## 2014-06-14 NOTE — Progress Notes (Signed)
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 female is here because of thrombocytopenia.  She was found to have abnormal CBC from routine blood work monitoring. She has low platelet count with prior pregnancy. She has 1 healthy son, born of C-section 5 years ago. In 2014, she loss her daughter and at the time she was noted to have mild thrombocytopenia. She is currently pregnant again with expected due date somewhere around 07/27/2014 to 08/03/2014.  She denies recent bruising/bleeding, such as spontaneous epistaxis, hematuria, melena or hematochezia She had history of excessive menorrhagia The patient denies history of liver disease, exposure to heparin, history of cardiac murmur/prior cardiovascular surgery or recent new medications She denies prior blood or platelet transfusions  MEDICAL HISTORY:  Past Medical History  Diagnosis Date  . PONV (postoperative nausea and vomiting)   . Anemia     history  . Headache(784.0)     otc med prn  . 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 Cesarean section  12/2008  . Wisdom tooth extraction    . Cesarean section N/A 08/24/2012    Procedure: CESAREAN SECTION;  Surgeon: Mickel Baasichard D Kaplan, MD;  Location: [facility];  Service: Obstetrics;  Laterality: N/A;    SOCIAL HISTORY: History   Social History  . Marital Status: Married    Spouse Name: N/A    Number of Children: N/A  . Years of Education: N/A   Occupational History  . Not on file.   Social History Main Topics  . Smoking status: Never Smoker   . Smokeless tobacco: Never Used  . Alcohol Use: No  . Drug Use: No  . Sexual Activity: Yes    Birth Control/  Protection: None     Comment: pregnant   Other Topics Concern  . Not on file   Social History Narrative    FAMILY HISTORY: History reviewed. No pertinent family history.  ALLERGIES:  has No Known Allergies.  MEDICATIONS:  Current Outpatient Prescriptions  Medication Sig Dispense Refill  . acetaminophen (TYLENOL) 325 MG tablet Take 650 mg by mouth daily as needed for pain (headache).    . predniSONE (DELTASONE) 20 MG tablet Take 3 tablets (60 mg total) by mouth daily with breakfast. 90 tablet 0  . Prenatal Vit-Fe Fumarate-FA (PRENATAL MULTIVITAMIN) TABS tablet Take 1 tablet by mouth daily at 12 noon.     No current facility-administered medications for this visit.    REVIEW OF SYSTEMS:   Constitutional: Denies fevers, chills or abnormal night sweats Eyes: Denies blurriness of vision, double vision or watery eyes Ears, nose, mouth, throat, and face: Denies mucositis or sore throat Respiratory: Denies cough, dyspnea or wheezes Cardiovascular: Denies palpitation, chest discomfort or lower extremity swelling Gastrointestinal:  Denies nausea, heartburn or change in bowel habits Skin: Denies abnormal skin rashes Lymphatics: Denies new lymphadenopathy or easy bruising Neurological:Denies numbness, tingling or new weaknesses Behavioral/Psych: Mood is stable, no new changes  All other systems were reviewed with the patient and are negative.  PHYSICAL EXAMINATION: ECOG PERFORMANCE STATUS: 0 - Asymptomatic  Filed Vitals:   06/14/14 1434  BP: 115/64  Pulse: 102  Temp: 98.6 F (37 C)  Resp: 20   Filed Weights   06/14/14 1434  Weight:  195 lb 12.8 oz (88.814 kg)    GENERAL:alert, no distress and comfortable SKIN: skin color, texture, turgor are normal, no rashes or significant lesions EYES: normal, conjunctiva are pink and non-injected, sclera clear OROPHARYNX:no exudate, no erythema and lips, buccal mucosa, and tongue normal  NECK: supple, thyroid normal size, non-tender,  without nodularity LYMPH:  no palpable lymphadenopathy in the cervical, axillary or inguinal LUNGS: clear to auscultation and percussion with normal breathing effort HEART: regular rate & rhythm and no murmurs and no lower extremity edema ABDOMEN:abdomen soft, non-tender and normal bowel sounds. Her abdomen is distended from pregnancy Musculoskeletal:no cyanosis of digits and no clubbing  PSYCH: alert & oriented x 3 with fluent speech NEURO: no focal motor/sensory deficits  LABORATORY DATA:  I have reviewed the data as listed Recent Results (from the past 2160 hour(s))  Urinalysis, Routine w reflex microscopic     Status: Abnormal   Collection Time: 05/18/14  9:08 AM  Result Value Ref Range   Color, Urine YELLOW YELLOW   APPearance CLOUDY (A) CLEAR   Specific Gravity, Urine 1.020 1.005 - 1.030   pH 6.0 5.0 - 8.0   Glucose, UA 100 (A) NEGATIVE mg/dL   Hgb urine dipstick LARGE (A) NEGATIVE   Bilirubin Urine NEGATIVE NEGATIVE   Ketones, ur NEGATIVE NEGATIVE mg/dL   Protein, ur NEGATIVE NEGATIVE mg/dL   Urobilinogen, UA 0.2 0.0 - 1.0 mg/dL   Nitrite NEGATIVE NEGATIVE   Leukocytes, UA SMALL (A) NEGATIVE  Urine microscopic-add on     Status: Abnormal   Collection Time: 05/18/14  9:08 AM  Result Value Ref Range   Squamous Epithelial / LPF FEW (A) RARE   RBC / HPF 21-50 <3 RBC/hpf   Bacteria, UA RARE RARE  Culture, OB Urine     Status: None   Collection Time: 05/18/14  9:08 AM  Result Value Ref Range   Specimen Description OB CLEAN CATCH    Special Requests Normal    Culture  Setup Time      05/18/2014 15:16 Performed at [facility]      10,000 COLONIES/ML Performed at [facility]    Culture      Multiple bacterial morphotypes present, none predominant. Suggest appropriate recollection if clinically indicated. Note: NO GROUP B STREP (S.AGALACTIAE) ISOLATED                                                              Culture based screening of  vaginal/anorectal swabs at  35 to [redacted] weeks gestation is required to rule out the carriage of Group B Streptococcus. Performed at [facility]    Report Status 05/19/2014 FINAL   Wet prep, genital     Status: Abnormal   Collection Time: 05/18/14 10:05 AM  Result Value Ref Range   Yeast Wet Prep HPF POC NONE SEEN NONE SEEN   Trich, Wet Prep NONE SEEN NONE SEEN   Clue Cells Wet Prep HPF POC NONE SEEN NONE SEEN   WBC, Wet Prep HPF POC FEW (A) NONE SEEN    Comment: FEW BACTERIA SEEN  CBC with Differential     Status: Abnormal   Collection Time: 06/14/14  3:24 PM  Result Value Ref Range   WBC 9.0 3.9 - 10.3  10e3/uL   NEUT# 7.0 (H) 1.5 - 6.5 10e3/uL   HGB 13.5 11.6 - 15.9 g/dL   HCT 78.239.8 95.634.8 - 21.346.6 %   Platelets 102 (L) 145 - 400 10e3/uL   MCV 93.0 79.5 - 101.0 fL   MCH 31.5 25.1 - 34.0 pg   MCHC 33.9 31.5 - 36.0 g/dL   RBC 0.864.28 5.783.70 - 4.695.45 10e6/uL   RDW 13.8 11.2 - 14.5 %   lymph# 1.4 0.9 - 3.3 10e3/uL   MONO# 0.5 0.1 - 0.9 10e3/uL   Eosinophils Absolute 0.1 0.0 - 0.5 10e3/uL   Basophils Absolute 0.0 0.0 - 0.1 10e3/uL   NEUT% 78.2 (H) 38.4 - 76.8 %   LYMPH% 15.1 14.0 - 49.7 %   MONO% 6.0 0.0 - 14.0 %   EOS% 0.6 0.0 - 7.0 %   BASO% 0.1 0.0 - 2.0 %   nRBC 0 0 - 0 %    ASSESSMENT & PLAN 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 I discussed with the patient and family the rationale of close platelet count monitoring until the time of delivery. We will initiate prednisone therapy if platelet count dropped to less than 100,000. The risks, benefit, side effects of prednisone is discussed with the patient and family members and she agreed to proceed. Stat CBC monitoring show platelet count was 102,000 today. She does not need to start treatment. I will see her back in a month

## 2014-06-14 NOTE — Telephone Encounter (Signed)
Gave avs & cal for Jan. °

## 2014-06-14 NOTE — Assessment & Plan Note (Signed)
She is not symptomatic. I discussed with the patient and family the rationale of close platelet count monitoring until the time of delivery. We will initiate prednisone therapy if platelet count dropped to less than 100,000. The risks, benefit, side effects of prednisone is discussed with the patient and family members and she agreed to proceed. Stat CBC monitoring show platelet count was 102,000 today. She does not need to start treatment. I will see her back in a month

## 2014-06-14 NOTE — Progress Notes (Signed)
Checked in new patient with no issues prior to seeing the dr. She has appt card and [place] been [place] the [place]. Per hubby--call him 1st

## 2014-06-21 ENCOUNTER — Telehealth: Payer: Self-pay | Admitting: *Deleted

## 2014-06-21 ENCOUNTER — Ambulatory Visit (HOSPITAL_BASED_OUTPATIENT_CLINIC_OR_DEPARTMENT_OTHER): Payer: Medicaid Other

## 2014-06-21 DIAGNOSIS — O99113 Other diseases of the blood and blood-forming organs and certain disorders involving the immune mechanism complicating pregnancy, third trimester: Principal | ICD-10-CM

## 2014-06-21 DIAGNOSIS — D693 Immune thrombocytopenic purpura: Secondary | ICD-10-CM

## 2014-06-21 DIAGNOSIS — D696 Thrombocytopenia, unspecified: Secondary | ICD-10-CM

## 2014-06-21 DIAGNOSIS — O9913 Other diseases of the blood and blood-forming organs and certain disorders involving the immune mechanism complicating the puerperium: Secondary | ICD-10-CM

## 2014-06-21 LAB — CBC WITH DIFFERENTIAL/PLATELET
BASO%: 0.2 % (ref 0.0–2.0)
BASOS ABS: 0 10*3/uL (ref 0.0–0.1)
EOS%: 0.4 % (ref 0.0–7.0)
Eosinophils Absolute: 0 10*3/uL (ref 0.0–0.5)
HCT: 38.2 % (ref 34.8–46.6)
HEMOGLOBIN: 13 g/dL (ref 11.6–15.9)
LYMPH#: 1.6 10*3/uL (ref 0.9–3.3)
LYMPH%: 16.2 % (ref 14.0–49.7)
MCH: 31.3 pg (ref 25.1–34.0)
MCHC: 34 g/dL (ref 31.5–36.0)
MCV: 91.8 fL (ref 79.5–101.0)
MONO#: 0.6 10*3/uL (ref 0.1–0.9)
MONO%: 5.8 % (ref 0.0–14.0)
NEUT%: 77.4 % — ABNORMAL HIGH (ref 38.4–76.8)
NEUTROS ABS: 7.7 10*3/uL — AB (ref 1.5–6.5)
Platelets: 106 10*3/uL — ABNORMAL LOW (ref 145–400)
RBC: 4.16 10*6/uL (ref 3.70–5.45)
RDW: 13.6 % (ref 11.2–14.5)
WBC: 10 10*3/uL (ref 3.9–10.3)

## 2014-06-21 NOTE — Telephone Encounter (Signed)
Pt notified of message below.

## 2014-06-21 NOTE — Telephone Encounter (Signed)
-----   Message from Artis DelayNi Gorsuch, MD sent at 06/21/2014  4:16 PM EST ----- Regarding: cbc Please let her know platelet is 106,000 No need to start prednisone ----- Message -----    From: Lab in [facility] Interface    Sent: 06/21/2014   4:09 PM      To: Artis DelayNi Gorsuch, MD

## 2014-06-28 ENCOUNTER — Other Ambulatory Visit (HOSPITAL_BASED_OUTPATIENT_CLINIC_OR_DEPARTMENT_OTHER): Payer: Medicaid Other

## 2014-06-28 DIAGNOSIS — O9913 Other diseases of the blood and blood-forming organs and certain disorders involving the immune mechanism complicating the puerperium: Secondary | ICD-10-CM

## 2014-06-28 DIAGNOSIS — D696 Thrombocytopenia, unspecified: Secondary | ICD-10-CM

## 2014-06-28 DIAGNOSIS — D693 Immune thrombocytopenic purpura: Secondary | ICD-10-CM

## 2014-06-28 DIAGNOSIS — O99113 Other diseases of the blood and blood-forming organs and certain disorders involving the immune mechanism complicating pregnancy, third trimester: Principal | ICD-10-CM

## 2014-06-28 LAB — CBC WITH DIFFERENTIAL/PLATELET
BASO%: 0.4 % (ref 0.0–2.0)
BASOS ABS: 0 10*3/uL (ref 0.0–0.1)
EOS ABS: 0.1 10*3/uL (ref 0.0–0.5)
EOS%: 0.7 % (ref 0.0–7.0)
HCT: 39.8 % (ref 34.8–46.6)
HEMOGLOBIN: 13.1 g/dL (ref 11.6–15.9)
LYMPH%: 14 % (ref 14.0–49.7)
MCH: 30.5 pg (ref 25.1–34.0)
MCHC: 33 g/dL (ref 31.5–36.0)
MCV: 92.5 fL (ref 79.5–101.0)
MONO#: 0.6 10*3/uL (ref 0.1–0.9)
MONO%: 5.6 % (ref 0.0–14.0)
NEUT#: 7.9 10*3/uL — ABNORMAL HIGH (ref 1.5–6.5)
NEUT%: 79.3 % — AB (ref 38.4–76.8)
Platelets: 106 10*3/uL — ABNORMAL LOW (ref 145–400)
RBC: 4.3 10*6/uL (ref 3.70–5.45)
RDW: 13.1 % (ref 11.2–14.5)
WBC: 9.9 10*3/uL (ref 3.9–10.3)
lymph#: 1.4 10*3/uL (ref 0.9–3.3)

## 2014-06-29 ENCOUNTER — Telehealth: Payer: Self-pay | Admitting: *Deleted

## 2014-06-29 NOTE — Telephone Encounter (Signed)
Instructed that labs are good, Do not take prednisone

## 2014-07-01 NOTE — L&D Delivery Note (Signed)
Cesarean Section Procedure Note  Indications: previous uterine incision kerr x2   Pre-operative Diagnosis: 37 week 4 day pregnancy, prior LTCS with SROM  Post-operative Diagnosis: same  Surgeon: Wynonia HazardPINN, Lakyra Tippins STACIA   Assistants: none  Anesthesia: Spinal anesthesia  ASA Class: 2  Procedure Details   The patient was counseled about the risks, benefits, complications of the cesarean section. The patient concurred with the proposed plan, giving informed consent. The site of surgery properly noted/marked. The patient was taken to Operating Room # 9, identified as [name] Vangorder and the procedure verified as C-Section Delivery. A Time Out was held and the above information confirmed.  After epidural was found to adequate , the patient was placed in the dorsal supine position with a leftward tilt, draped and prepped in the usual sterile manner. A Pfannenstiel incision was made with a 10 blade scalpel and carried down through the subcutaneous tissue to the fascia. The fascia was incised in the midline and the fascial incision was extended laterally with Mayo scissors. The superior aspect of the fascial incision was grasped with Coker clamps x2, tented up and the rectus muscles dissected off sharply with the scalpel. The rectus was then dissected off with blunt dissection and Mayo scissors inferiorly. The rectus muscles were separated in the midline. The abdominal peritoneum was identified, tented up, entered sharply, and the incision was extended superiorly and inferiorly with good visualization of the bladder. The Alexis retractor was then deployed. Scalpel was then used to make a low transverse incision on the uterus which was extended laterally with blunt dissection. The fetal vertex was identified, with help from a nurse pushing up on the head vaginally, the head was brought out from the pelvis. A Mity Vacuum was placed to aid in delivery of the vertex. The head was then delivered easily through  the uterine incision followed by the body. The A live female infant was bulb suctioned on the operative field cried vigorously, cord was clamped and cut and the infant was passed to the waiting neonatologist. Apgars 8/9. Placenta was then delivered spontaneously, intact and appear normal, the uterus was cleared of all clot and debris. The uterine incision was repaired with #0 Monocryl in running locked fashion. A second imbricating suture was performed using the same suture. The incision was hemostatic. Ovaries and tubes were inspected and normal. The Alexis retractor was removed. The abdominal cavity was cleared of all clot and debris. The abdominal peritoneum was reapproximated with 2-0 chromic in a running fashion, the rectus muscles was reapproximated with #2 chromic in interrupted fashion. The fascia was closed with 0 Vicryl in a running fashion. The subcuticular layer was irrigated and all bleeders cauterized. The subcutaneous layer was re-approximated with interrupted suture of 2-0 plain.  The skin was closed with 4-0 vicryl in a subcuticular fashion using a Mellody DanceKeith needle. The incision was dressed with benzoine, steri strips and pressure dressing. All sponge lap and needle counts were correct x3. Patient tolerated the procedure well and recovered in stable condition following the procedure.  Instrument, sponge, and needle counts were correct prior the abdominal closure and at the conclusion of the case.   Findings: Live female infant, Apgars 8/9, clear amniotic fluid, normal appearing placenta, normal uterus, bilateral tubes and ovaries  Estimated Blood Loss: 600mL   Drains: Foley catheter 350 cc clear urine at end of procedure   Specimens: Placenta to L&D   Implants: none   Complications: None; patient tolerated the procedure well.   Disposition: PACU -  hemodynamically stable.   Essie Hart STACIA       Routing History     Date/Time From To  Method   07/30/2014 12:51 AM Essie Hart, MD Essie Hart, MD Fax   07/30/2014 12:51 AM Essie Hart, MD No Pcp Per Patient In Basket

## 2014-07-05 ENCOUNTER — Encounter (HOSPITAL_COMMUNITY): Payer: Self-pay | Admitting: General Practice

## 2014-07-05 ENCOUNTER — Other Ambulatory Visit (HOSPITAL_BASED_OUTPATIENT_CLINIC_OR_DEPARTMENT_OTHER): Payer: Medicaid Other

## 2014-07-05 ENCOUNTER — Inpatient Hospital Stay (HOSPITAL_COMMUNITY)
Admission: AD | Admit: 2014-07-05 | Discharge: 2014-07-05 | Disposition: A | Discharge status: Home / Self Care | Payer: Medicaid Other | Source: Ambulatory Visit | Attending: Obstetrics & Gynecology | Admitting: Obstetrics & Gynecology

## 2014-07-05 ENCOUNTER — Inpatient Hospital Stay (HOSPITAL_COMMUNITY): Payer: Medicaid Other

## 2014-07-05 ENCOUNTER — Telehealth: Payer: Self-pay | Admitting: *Deleted

## 2014-07-05 DIAGNOSIS — O99113 Other diseases of the blood and blood-forming organs and certain disorders involving the immune mechanism complicating pregnancy, third trimester: Principal | ICD-10-CM

## 2014-07-05 DIAGNOSIS — O09293 Supervision of pregnancy with other poor reproductive or obstetric history, third trimester: Secondary | ICD-10-CM | POA: Insufficient documentation

## 2014-07-05 DIAGNOSIS — IMO0002 Reserved for concepts with insufficient information to code with codable children: Secondary | ICD-10-CM

## 2014-07-05 DIAGNOSIS — D693 Immune thrombocytopenic purpura: Secondary | ICD-10-CM

## 2014-07-05 DIAGNOSIS — O09299 Supervision of pregnancy with other poor reproductive or obstetric history, unspecified trimester: Secondary | ICD-10-CM | POA: Insufficient documentation

## 2014-07-05 DIAGNOSIS — Z3A33 33 weeks gestation of pregnancy: Secondary | ICD-10-CM | POA: Diagnosis not present

## 2014-07-05 DIAGNOSIS — D696 Thrombocytopenia, unspecified: Secondary | ICD-10-CM

## 2014-07-05 DIAGNOSIS — O9913 Other diseases of the blood and blood-forming organs and certain disorders involving the immune mechanism complicating the puerperium: Secondary | ICD-10-CM

## 2014-07-05 LAB — CBC WITH DIFFERENTIAL/PLATELET
BASO%: 0.4 % (ref 0.0–2.0)
BASOS ABS: 0.1 10*3/uL (ref 0.0–0.1)
EOS ABS: 0 10*3/uL (ref 0.0–0.5)
EOS%: 0.2 % (ref 0.0–7.0)
HEMATOCRIT: 42.7 % (ref 34.8–46.6)
HEMOGLOBIN: 13.9 g/dL (ref 11.6–15.9)
LYMPH#: 1.4 10*3/uL (ref 0.9–3.3)
LYMPH%: 11.5 % — ABNORMAL LOW (ref 14.0–49.7)
MCH: 30.4 pg (ref 25.1–34.0)
MCHC: 32.6 g/dL (ref 31.5–36.0)
MCV: 93.2 fL (ref 79.5–101.0)
MONO#: 0.4 10*3/uL (ref 0.1–0.9)
MONO%: 2.9 % (ref 0.0–14.0)
NEUT%: 85 % — ABNORMAL HIGH (ref 38.4–76.8)
NEUTROS ABS: 10.7 10*3/uL — AB (ref 1.5–6.5)
PLATELETS: 113 10*3/uL — AB (ref 145–400)
RBC: 4.58 10*6/uL (ref 3.70–5.45)
RDW: 13.5 % (ref 11.2–14.5)
WBC: 12.6 10*3/uL — AB (ref 3.9–10.3)

## 2014-07-05 NOTE — Telephone Encounter (Signed)
-----   Message from Artis DelayNi Gorsuch, MD sent at 07/05/2014  3:43 PM EST ----- Regarding: platelet OK   ----- Message -----    From: Lab in [facility] Interface    Sent: 07/05/2014   3:40 PM      To: Artis DelayNi Gorsuch, MD

## 2014-07-05 NOTE — MAU Note (Addendum)
[facility], ? FH up and down, sent here for US and further eval  ( hx of loss)

## 2014-07-05 NOTE — MAU Note (Signed)
No hx of GDm or BP elevation

## 2014-07-05 NOTE — Telephone Encounter (Signed)
Gave pt copy of lab results, informed her of platelet count stable and return for lab next week as scheduled.  She verbalized understanding.

## 2014-07-05 NOTE — Discharge Instructions (Signed)

## 2014-07-05 NOTE — MAU Provider Note (Signed)
Chief Complaint:  further eval of fetal heart tracing    First Provider Initiated Contact with Patient 07/05/14 1208      HPI: Ebony [name] is a 34 y.o. G3P2001 at 5311w6d who was [facility] matern[facility] for NST w/ low baseline 110's and possible audible decels to 100's.  Denies contractions, leakage of fluid or vaginal bleeding. Good fetal movement.   Pregnancy Course: Antenatal testing for Hx 39 weeks IUFD.    Past Medical History: Past Medical History  Diagnosis Date  . PONV (postoperative nausea and vomiting)   . Anemia     history  . Headache(784.0)     otc med prn  . 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 Cesarean section  12/2008  . Wisdom tooth extraction    . Cesarean section N/A 08/24/2012    Procedure: CESAREAN SECTION;  Surgeon: Mickel Baasichard D Kaplan, MD;  Location: [facility];  Service: Obstetrics;  Laterality: N/A;     Family History: History reviewed. No pertinent family history.  Social History: History  Substance Use Topics  . Smoking status: Never Smoker   . Smokeless tobacco: Never Used  . Alcohol Use: No    Allergies: No Known Allergies  Meds:  Prescriptions prior to admission  Medication Sig Dispense Refill Last Dose  . acetaminophen (TYLENOL) 325 MG tablet Take 325 mg by mouth daily as needed for pain (headache).    Past Week at Unknown time  . Prenatal Vit-Fe Fumarate-FA (PRENATAL MULTIVITAMIN) TABS tablet Take 1 tablet by mouth daily at 12 noon.   07/05/2014 at  Unknown time  . predniSONE (DELTASONE) 20 MG tablet Take 3 tablets (60 mg total) by mouth daily with breakfast. (Patient not taking: Reported on 07/05/2014) 90 tablet 0     ROS: Pertinent findings in history of present illness.  Physical Exam  Blood pressure 150/78, pulse 99, temperature 98.2 F (36.8 C), temperature source Oral, resp. rate 18.  Patient Vitals for the past 24 hrs:  BP Temp Temp src Pulse Resp  07/05/14 1423 150/78 mmHg - - 99 18  07/05/14 1131 122/70 mmHg 98.2 F (36.8 C) Oral 84 18   GENERAL: Well-developed, well-nourished female in no acute distress.  HEENT: normocephalic HEART: normal rate RESP: normal effort ABDOMEN: Soft, non-tender, gravid appropriate for gestational age EXTREMITIES: Nontender, no edema NEURO: alert and oriented SPECULUM EXAM: Deferred   FHT:  Baseline 100-110 , moderate variability, accelerations present, no decelerations Contractions: UI   Labs: No results found for this or any previous visit (from the past 24 hour(s)).  Imaging:  BPP 8/8  MAU Course: Discusses low baseline or NST, but otherwise reactive tracing.   Assessment: 1. Fetal heart rate nonreactive   2. Prior pregnancy with fetal demise and current pregnancy, third trimester     Plan: Discharge home in  stable condition per consult w. Dr. Mora Appl. Preter labor precautions and fetal kick counts Follow-up Information    Follow up with [facility], Sanjuana Mae, MD.   Specialty:  Obstetrics and Gynecology   Why:  schedule an appointment for friday 07/08/14   Contact information:   [address] [place] [address] [phone]       Follow up with [facility].   Why:  As needed in emergencies   Contact information:   [address] [id number] mc [place] [address] [phone]       Medication List    STOP taking these medications        predniSONE 20 MG tablet  Commonly known as:   DELTASONE      TAKE these medications        acetaminophen 325 MG tablet  Commonly known as:  TYLENOL  Take 325 mg by mouth daily as needed for pain (headache).     prenatal multivitamin Tabs tablet  Take 1 tablet by mouth daily at 12 noon.        [place], [place] 07/05/2014 2:24 PM

## 2014-07-12 ENCOUNTER — Telehealth: Payer: Self-pay | Admitting: Hematology and Oncology

## 2014-07-12 ENCOUNTER — Other Ambulatory Visit (HOSPITAL_BASED_OUTPATIENT_CLINIC_OR_DEPARTMENT_OTHER): Payer: Medicaid Other

## 2014-07-12 ENCOUNTER — Ambulatory Visit (HOSPITAL_BASED_OUTPATIENT_CLINIC_OR_DEPARTMENT_OTHER): Payer: Medicaid Other | Admitting: Hematology and Oncology

## 2014-07-12 ENCOUNTER — Encounter: Payer: Self-pay | Admitting: Hematology and Oncology

## 2014-07-12 VITALS — BP 125/67 | HR 93 | Temp 98.0°F | Resp 20 | Ht 65.0 in | Wt 201.5 lb

## 2014-07-12 DIAGNOSIS — D693 Immune thrombocytopenic purpura: Secondary | ICD-10-CM

## 2014-07-12 DIAGNOSIS — O99113 Other diseases of the blood and blood-forming organs and certain disorders involving the immune mechanism complicating pregnancy, third trimester: Secondary | ICD-10-CM

## 2014-07-12 LAB — CBC WITH DIFFERENTIAL/PLATELET
BASO%: 0.2 % (ref 0.0–2.0)
BASOS ABS: 0 10*3/uL (ref 0.0–0.1)
EOS ABS: 0 10*3/uL (ref 0.0–0.5)
EOS%: 0 % (ref 0.0–7.0)
HEMATOCRIT: 40.9 % (ref 34.8–46.6)
HEMOGLOBIN: 14 g/dL (ref 11.6–15.9)
LYMPH#: 1.9 10*3/uL (ref 0.9–3.3)
LYMPH%: 15.6 % (ref 14.0–49.7)
MCH: 31.8 pg (ref 25.1–34.0)
MCHC: 34.2 g/dL (ref 31.5–36.0)
MCV: 93 fL (ref 79.5–101.0)
MONO#: 0.9 10*3/uL (ref 0.1–0.9)
MONO%: 6.9 % (ref 0.0–14.0)
NEUT%: 77.3 % — ABNORMAL HIGH (ref 38.4–76.8)
NEUTROS ABS: 9.6 10*3/uL — AB (ref 1.5–6.5)
Platelets: 105 10*3/uL — ABNORMAL LOW (ref 145–400)
RBC: 4.4 10*6/uL (ref 3.70–5.45)
RDW: 13.6 % (ref 11.2–14.5)
WBC: 12.3 10*3/uL — AB (ref 3.9–10.3)

## 2014-07-12 NOTE — Assessment & Plan Note (Signed)
She is doing well. Platelet count if holding stable >100,000. She will continue weekly lab monitoring. She is not symptomatic.  We will initiate prednisone therapy if platelet count dropped to less than 100,000. The risks, benefit, side effects of prednisone is discussed with the patient and family members and she agreed to proceed. I will see her back in 2 months post-partum to make sure the thrombocytopenia resolves by then

## 2014-07-12 NOTE — Telephone Encounter (Signed)
Gave avs & cal for Jan thru March. °

## 2014-07-12 NOTE — Progress Notes (Signed)
Ebony [name]  No PCP Per Patient SUMMARY OF HEMATOLOGIC HISTORY: She was found to have abnormal CBC from routine blood work monitoring. She has low platelet count with prior pregnancy. She has 1 healthy son, born of C-section 5 years ago. In 2014, she loss her daughter and at the time she was noted to have mild thrombocytopenia. She is currently pregnant again with expected due date somewhere around 07/27/2014 to 08/03/2014. She denies recent bruising/bleeding, such as spontaneous epistaxis, hematuria, melena or hematochezia She had history of excessive menorrhagia The patient denies history of liver disease, exposure to heparin, history of cardiac murmur/prior cardiovascular surgery or recent new medications She denies prior blood or platelet transfusions INTERVAL HISTORY: Ebony [name] 34 y.o. female returns for further follow-up The patient denies any recent signs or symptoms of bleeding such as spontaneous epistaxis, hematuria or hematochezia.  I have reviewed the past medical history, past surgical history, social history and family history with the patient and they are unchanged from previous [name].  ALLERGIES:  has No Known Allergies.  MEDICATIONS:  Current Outpatient Prescriptions  Medication Sig Dispense Refill  . acetaminophen (TYLENOL) 325 MG tablet Take 325 mg by mouth daily as needed for pain (headache).     . Prenatal Vit-Fe Fumarate-FA (PRENATAL MULTIVITAMIN) TABS tablet Take 1 tablet by mouth daily at 12 noon.     No current facility-administered medications for this visit.     REVIEW OF SYSTEMS:   Constitutional: Denies fevers, chills or night sweats Eyes: Denies blurriness of vision Ears, nose, mouth, throat, and face: Denies mucositis or sore throat Respiratory: Denies cough, dyspnea or wheezes Cardiovascular: Denies palpitation, chest discomfort or lower extremity swelling Gastrointestinal:  Denies nausea, heartburn or change in  bowel habits Skin: Denies abnormal skin rashes Lymphatics: Denies new lymphadenopathy or easy bruising Neurological:Denies numbness, tingling or new weaknesses Behavioral/Psych: Mood is stable, no new changes  All other systems were reviewed with the patient and are negative.  PHYSICAL EXAMINATION: ECOG PERFORMANCE STATUS: 0 - Asymptomatic  Filed Vitals:   07/12/14 1553  BP: 125/67  Pulse: 93  Temp: 98 F (36.7 C)  Resp: 20   Filed Weights   07/12/14 1553  Weight: 201 lb 8 oz (91.4 kg)    GENERAL:alert, no distress and comfortable SKIN: skin color, texture, turgor are normal, no rashes or significant lesions EYES: normal, Conjunctiva are pink and non-injected, sclera clear Musculoskeletal:no cyanosis of digits and no clubbing  NEURO: alert & oriented x 3 with fluent speech, no focal motor/sensory deficits  LABORATORY DATA:  I have reviewed the data as listed Results for orders placed or performed in visit on 07/12/14 (from the past 48 hour(s))  CBC with Differential     Status: Abnormal   Collection Time: 07/12/14  3:31 PM  Result Value Ref Range   WBC 12.3 (H) 3.9 - 10.3 10e3/uL   NEUT# 9.6 (H) 1.5 - 6.5 10e3/uL   HGB 14.0 11.6 - 15.9 g/dL   HCT 16.1 09.6 - 04.5 %   Platelets 105 (L) 145 - 400 10e3/uL   MCV 93.0 79.5 - 101.0 fL   MCH 31.8 25.1 - 34.0 pg   MCHC 34.2 31.5 - 36.0 g/dL   RBC 4.09 8.11 - 9.14 10e6/uL   RDW 13.6 11.2 - 14.5 %   lymph# 1.9 0.9 - 3.3 10e3/uL   MONO# 0.9 0.1 - 0.9 10e3/uL   Eosinophils Absolute 0.0 0.0 - 0.5 10e3/uL   Basophils Absolute 0.0 0.0 -  0.1 10e3/uL   NEUT% 77.3 (H) 38.4 - 76.8 %   LYMPH% 15.6 14.0 - 49.7 %   MONO% 6.9 0.0 - 14.0 %   EOS% 0.0 0.0 - 7.0 %   BASO% 0.2 0.0 - 2.0 %    Lab Results  Component Value Date   WBC 12.3* 07/12/2014   HGB 14.0 07/12/2014   HCT 40.9 07/12/2014   MCV 93.0 07/12/2014   PLT 105* 07/12/2014    ASSESSMENT & PLAN:  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 Platelet count if holding stable >100,000. She will continue weekly lab monitoring. She is not symptomatic.  We will initiate prednisone therapy if platelet count dropped to less than 100,000. The risks, benefit, side effects of prednisone is discussed with the patient and family members and she agreed to proceed. I will see her back in 2 months post-partum to make sure the thrombocytopenia resolves by then     All questions were answered. The patient knows to call the clinic with any problems, questions or concerns. No barriers to learning was detected.  I spent 15 minutes counseling the patient face to face. The total time spent in the appointment was 20 minutes and more than 50% was on counseling.     [facility] Institute[facility]OR[facility]CH, Cameran Pettey, MD 07/12/2014 3:56 PM

## 2014-07-15 ENCOUNTER — Other Ambulatory Visit: Payer: Self-pay | Admitting: Obstetrics and Gynecology

## 2014-07-15 LAB — OB RESULTS CONSOLE GBS: STREP GROUP B AG: NEGATIVE

## 2014-07-19 ENCOUNTER — Telehealth: Payer: Self-pay | Admitting: *Deleted

## 2014-07-19 ENCOUNTER — Other Ambulatory Visit (HOSPITAL_BASED_OUTPATIENT_CLINIC_OR_DEPARTMENT_OTHER): Payer: Medicaid Other

## 2014-07-19 DIAGNOSIS — D693 Immune thrombocytopenic purpura: Secondary | ICD-10-CM

## 2014-07-19 DIAGNOSIS — O99113 Other diseases of the blood and blood-forming organs and certain disorders involving the immune mechanism complicating pregnancy, third trimester: Secondary | ICD-10-CM

## 2014-07-19 LAB — CBC WITH DIFFERENTIAL/PLATELET
BASO%: 0.1 % (ref 0.0–2.0)
Basophils Absolute: 0 10e3/uL (ref 0.0–0.1)
EOS%: 0.1 % (ref 0.0–7.0)
Eosinophils Absolute: 0 10e3/uL (ref 0.0–0.5)
HCT: 39.9 % (ref 34.8–46.6)
HGB: 13.5 g/dL (ref 11.6–15.9)
LYMPH%: 15.6 % (ref 14.0–49.7)
MCH: 31.5 pg (ref 25.1–34.0)
MCHC: 33.8 g/dL (ref 31.5–36.0)
MCV: 93 fL (ref 79.5–101.0)
MONO#: 0.7 10e3/uL (ref 0.1–0.9)
MONO%: 6.2 % (ref 0.0–14.0)
NEUT#: 8.1 10e3/uL — ABNORMAL HIGH (ref 1.5–6.5)
NEUT%: 78 % — ABNORMAL HIGH (ref 38.4–76.8)
Platelets: 103 10e3/uL — ABNORMAL LOW (ref 145–400)
RBC: 4.29 10e6/uL (ref 3.70–5.45)
RDW: 13.9 % (ref 11.2–14.5)
WBC: 10.4 10e3/uL — ABNORMAL HIGH (ref 3.9–10.3)
lymph#: 1.6 10e3/uL (ref 0.9–3.3)

## 2014-07-19 NOTE — Telephone Encounter (Signed)
Duplicate note opened in error  

## 2014-07-19 NOTE — Telephone Encounter (Signed)
-----   Message from Artis DelayNi Gorsuch, MD sent at 07/19/2014  4:27 PM EST ----- Regarding: platelet OK   ----- Message -----    From: Lab in [facility] Interface    Sent: 07/19/2014   3:46 PM      To: Artis DelayNi Gorsuch, MD

## 2014-07-19 NOTE — Telephone Encounter (Signed)
Informed pt of Platelet count is ok today.  She is aware and verbalized understanding.

## 2014-07-26 ENCOUNTER — Other Ambulatory Visit (HOSPITAL_BASED_OUTPATIENT_CLINIC_OR_DEPARTMENT_OTHER): Payer: Medicaid Other

## 2014-07-26 ENCOUNTER — Telehealth: Payer: Self-pay | Admitting: *Deleted

## 2014-07-26 DIAGNOSIS — O99113 Other diseases of the blood and blood-forming organs and certain disorders involving the immune mechanism complicating pregnancy, third trimester: Secondary | ICD-10-CM

## 2014-07-26 DIAGNOSIS — D693 Immune thrombocytopenic purpura: Secondary | ICD-10-CM

## 2014-07-26 LAB — CBC WITH DIFFERENTIAL/PLATELET
BASO%: 0.1 % (ref 0.0–2.0)
BASOS ABS: 0 10*3/uL (ref 0.0–0.1)
EOS%: 0 % (ref 0.0–7.0)
Eosinophils Absolute: 0 10*3/uL (ref 0.0–0.5)
HEMATOCRIT: 40.8 % (ref 34.8–46.6)
HGB: 14 g/dL (ref 11.6–15.9)
LYMPH#: 1.5 10*3/uL (ref 0.9–3.3)
LYMPH%: 15 % (ref 14.0–49.7)
MCH: 31.7 pg (ref 25.1–34.0)
MCHC: 34.3 g/dL (ref 31.5–36.0)
MCV: 92.5 fL (ref 79.5–101.0)
MONO#: 0.4 10*3/uL (ref 0.1–0.9)
MONO%: 4 % (ref 0.0–14.0)
NEUT%: 80.9 % — ABNORMAL HIGH (ref 38.4–76.8)
NEUTROS ABS: 8.3 10*3/uL — AB (ref 1.5–6.5)
Platelets: 108 10*3/uL — ABNORMAL LOW (ref 145–400)
RBC: 4.41 10*6/uL (ref 3.70–5.45)
RDW: 13.8 % (ref 11.2–14.5)
WBC: 10.3 10*3/uL (ref 3.9–10.3)

## 2014-07-26 NOTE — Telephone Encounter (Signed)
-----   Message from Artis DelayNi Gorsuch, MD sent at 07/26/2014  3:31 PM EST ----- Regarding: cbc looks great   ----- Message -----    From: Lab in [facility] Interface    Sent: 07/26/2014   3:25 PM      To: Artis DelayNi Gorsuch, MD

## 2014-07-26 NOTE — Telephone Encounter (Signed)
Left VM for pt informing her of Dr. Bertis RuddyGorsuch message below and Platelet count.  Keep appt for lab next week as scheduled and call us back if any questions.

## 2014-07-29 ENCOUNTER — Inpatient Hospital Stay (HOSPITAL_COMMUNITY)
Admission: AD | Admit: 2014-07-29 | Discharge: 2014-08-02 | DRG: 766 | Disposition: A | Discharge status: Home / Self Care | Payer: Medicaid Other | Source: Ambulatory Visit | Attending: Obstetrics & Gynecology | Admitting: Obstetrics & Gynecology

## 2014-07-29 ENCOUNTER — Encounter (HOSPITAL_COMMUNITY)
Admission: AD | Disposition: A | Discharge status: Home / Self Care | Payer: Self-pay | Source: Ambulatory Visit | Attending: Obstetrics & Gynecology

## 2014-07-29 ENCOUNTER — Encounter (HOSPITAL_COMMUNITY): Payer: Self-pay | Admitting: *Deleted

## 2014-07-29 ENCOUNTER — Inpatient Hospital Stay (HOSPITAL_COMMUNITY): Payer: Medicaid Other | Admitting: Anesthesiology

## 2014-07-29 DIAGNOSIS — R52 Pain, unspecified: Secondary | ICD-10-CM

## 2014-07-29 DIAGNOSIS — O09523 Supervision of elderly multigravida, third trimester: Secondary | ICD-10-CM

## 2014-07-29 DIAGNOSIS — O09293 Supervision of pregnancy with other poor reproductive or obstetric history, third trimester: Secondary | ICD-10-CM | POA: Diagnosis not present

## 2014-07-29 DIAGNOSIS — M5126 Other intervertebral disc displacement, lumbar region: Secondary | ICD-10-CM | POA: Diagnosis present

## 2014-07-29 DIAGNOSIS — R200 Anesthesia of skin: Secondary | ICD-10-CM

## 2014-07-29 DIAGNOSIS — Z3A37 37 weeks gestation of pregnancy: Secondary | ICD-10-CM | POA: Diagnosis present

## 2014-07-29 DIAGNOSIS — O9989 Other specified diseases and conditions complicating pregnancy, childbirth and the puerperium: Secondary | ICD-10-CM | POA: Diagnosis present

## 2014-07-29 DIAGNOSIS — R202 Paresthesia of skin: Secondary | ICD-10-CM

## 2014-07-29 DIAGNOSIS — Z98891 History of uterine scar from previous surgery: Secondary | ICD-10-CM

## 2014-07-29 DIAGNOSIS — O3421 Maternal care for scar from previous cesarean delivery: Principal | ICD-10-CM | POA: Diagnosis present

## 2014-07-29 LAB — TYPE AND SCREEN
ABO/RH(D): O POS
Antibody Screen: NEGATIVE

## 2014-07-29 LAB — CBC
HCT: 40 % (ref 36.0–46.0)
Hemoglobin: 13.6 g/dL (ref 12.0–15.0)
MCH: 31.4 pg (ref 26.0–34.0)
MCHC: 34 g/dL (ref 30.0–36.0)
MCV: 92.4 fL (ref 78.0–100.0)
Platelets: 107 10*3/uL — ABNORMAL LOW (ref 150–400)
RBC: 4.33 MIL/uL (ref 3.87–5.11)
RDW: 14 % (ref 11.5–15.5)
WBC: 9.9 10*3/uL (ref 4.0–10.5)

## 2014-07-29 SURGERY — Surgical Case
Anesthesia: Spinal | Site: Abdomen

## 2014-07-29 MED ORDER — CEFAZOLIN SODIUM-DEXTROSE 2-3 GM-% IV SOLR
2.0000 g | INTRAVENOUS | Status: AC
  Administered 2014-07-29: 2 g via INTRAVENOUS
  Filled 2014-07-29: qty 50

## 2014-07-29 MED ORDER — PHENYLEPHRINE 8 MG IN D5W 100 ML (0.08MG/ML) PREMIX OPTIME
INJECTION | INTRAVENOUS | Status: DC | PRN
  Administered 2014-07-29: 80 ug/min via INTRAVENOUS

## 2014-07-29 MED ORDER — MORPHINE SULFATE (PF) 0.5 MG/ML IJ SOLN
INTRAMUSCULAR | Status: DC | PRN
  Administered 2014-07-29: .2 ug via INTRATHECAL

## 2014-07-29 MED ORDER — CEFAZOLIN SODIUM-DEXTROSE 2-3 GM-% IV SOLR
INTRAVENOUS | Status: AC
  Filled 2014-07-29: qty 50

## 2014-07-29 MED ORDER — FENTANYL CITRATE 0.05 MG/ML IJ SOLN
INTRAMUSCULAR | Status: DC | PRN
  Administered 2014-07-29: 10 ug via INTRATHECAL
  Administered 2014-07-30 (×2): 45 ug via INTRAVENOUS

## 2014-07-29 MED ORDER — METOCLOPRAMIDE HCL 5 MG/ML IJ SOLN
10.0000 mg | Freq: Once | INTRAMUSCULAR | Status: AC
  Administered 2014-07-29: 10 mg via INTRAVENOUS
  Filled 2014-07-29: qty 2

## 2014-07-29 MED ORDER — FAMOTIDINE IN NACL 20-0.9 MG/50ML-% IV SOLN
20.0000 mg | Freq: Once | INTRAVENOUS | Status: AC
Start: 2014-07-29 — End: 2014-07-29
  Administered 2014-07-29: 20 mg via INTRAVENOUS
  Filled 2014-07-29: qty 50

## 2014-07-29 MED ORDER — FENTANYL CITRATE 0.05 MG/ML IJ SOLN
INTRAMUSCULAR | Status: AC
  Filled 2014-07-29: qty 2

## 2014-07-29 MED ORDER — ONDANSETRON HCL 4 MG/2ML IJ SOLN
INTRAMUSCULAR | Status: AC
  Filled 2014-07-29: qty 2

## 2014-07-29 MED ORDER — PHENYLEPHRINE 8 MG IN D5W 100 ML (0.08MG/ML) PREMIX OPTIME
INJECTION | INTRAVENOUS | Status: AC
  Filled 2014-07-29: qty 100

## 2014-07-29 MED ORDER — PHENYLEPHRINE HCL 10 MG/ML IJ SOLN
INTRAMUSCULAR | Status: DC | PRN
  Administered 2014-07-29: 80 ug via INTRAVENOUS

## 2014-07-29 MED ORDER — LACTATED RINGERS IV SOLN
INTRAVENOUS | Status: DC
  Administered 2014-07-29 (×3): via INTRAVENOUS

## 2014-07-29 MED ORDER — OXYTOCIN 10 UNIT/ML IJ SOLN
INTRAMUSCULAR | Status: AC
  Filled 2014-07-29: qty 4

## 2014-07-29 MED ORDER — BUPIVACAINE IN DEXTROSE 0.75-8.25 % IT SOLN
INTRATHECAL | Status: DC | PRN
  Administered 2014-07-29: 1.8 mg via INTRATHECAL

## 2014-07-29 MED ORDER — MORPHINE SULFATE 0.5 MG/ML IJ SOLN
INTRAMUSCULAR | Status: AC
  Filled 2014-07-29: qty 10

## 2014-07-29 MED ORDER — CITRIC ACID-SODIUM CITRATE 334-500 MG/5ML PO SOLN
30.0000 mL | Freq: Once | ORAL | Status: AC
  Administered 2014-07-29: 30 mL via ORAL
  Filled 2014-07-29: qty 15

## 2014-07-29 SURGICAL SUPPLY — 35 items
BENZOIN TINCTURE PRP APPL 2/3 (GAUZE/BANDAGES/DRESSINGS) ×3 IMPLANT
CLAMP CORD UMBIL (MISCELLANEOUS) IMPLANT
CLOSURE WOUND 1/2 X4 (GAUZE/BANDAGES/DRESSINGS) ×1
CLOTH BEACON ORANGE TIMEOUT ST (SAFETY) ×3 IMPLANT
DRAPE SHEET LG 3/4 BI-LAMINATE (DRAPES) IMPLANT
DRSG OPSITE POSTOP 4X10 (GAUZE/BANDAGES/DRESSINGS) ×3 IMPLANT
DURAPREP 26ML APPLICATOR (WOUND CARE) ×3 IMPLANT
ELECT REM PT RETURN 9FT ADLT (ELECTROSURGICAL) ×3
ELECTRODE REM PT RTRN 9FT ADLT (ELECTROSURGICAL) ×1 IMPLANT
EXTRACTOR VACUUM BELL STYLE (SUCTIONS) ×3 IMPLANT
GLOVE BIO SURGEON STRL SZ 6.5 (GLOVE) ×2 IMPLANT
GLOVE BIO SURGEONS STRL SZ 6.5 (GLOVE) ×1
GLOVE BIOGEL PI IND STRL 7.0 (GLOVE) ×1 IMPLANT
GLOVE BIOGEL PI INDICATOR 7.0 (GLOVE) ×2
GOWN STRL REUS W/TWL LRG LVL3 (GOWN DISPOSABLE) ×9 IMPLANT
KIT ABG SYR 3ML LUER SLIP (SYRINGE) IMPLANT
NEEDLE HYPO 25X5/8 SAFETYGLIDE (NEEDLE) IMPLANT
NS IRRIG 1000ML POUR BTL (IV SOLUTION) ×3 IMPLANT
PACK C SECTION WH (CUSTOM PROCEDURE TRAY) ×3 IMPLANT
PAD OB MATERNITY 4.3X12.25 (PERSONAL CARE ITEMS) ×3 IMPLANT
RTRCTR C-SECT PINK 25CM LRG (MISCELLANEOUS) ×3 IMPLANT
STAPLER VISISTAT 35W (STAPLE) IMPLANT
STRIP CLOSURE SKIN 1/2X4 (GAUZE/BANDAGES/DRESSINGS) ×2 IMPLANT
SUT CHROMIC 2 0 CT 1 (SUTURE) ×3 IMPLANT
SUT CHROMIC 3 0 SH 27 (SUTURE) ×3 IMPLANT
SUT MNCRL 0 VIOLET CTX 36 (SUTURE) ×2 IMPLANT
SUT MONOCRYL 0 CTX 36 (SUTURE) ×4
SUT PDS AB 0 CTX 36 PDP370T (SUTURE) ×6 IMPLANT
SUT PLAIN 2 0 (SUTURE) ×2
SUT PLAIN ABS 2-0 CT1 27XMFL (SUTURE) ×1 IMPLANT
SUT VIC AB 0 CT1 27 (SUTURE) ×2
SUT VIC AB 0 CT1 27XBRD ANBCTR (SUTURE) ×1 IMPLANT
SUT VIC AB 4-0 KS 27 (SUTURE) ×3 IMPLANT
TOWEL OR 17X24 6PK STRL BLUE (TOWEL DISPOSABLE) ×3 IMPLANT
TRAY FOLEY CATH 14FR (SET/KITS/TRAYS/PACK) ×3 IMPLANT

## 2014-07-29 NOTE — Anesthesia Preprocedure Evaluation (Addendum)
Anesthesia Evaluation  Patient identified by MRN, date of birth, ID band Patient awake    Reviewed: Allergy & Precautions, NPO status , Patient's Chart, lab work & pertinent test results  History of Anesthesia Complications (+) PONV and history of anesthetic complications  Airway Mallampati: III  TM Distance: >3 FB Neck ROM: Full    Dental no notable dental hx. 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, on 07/26/14 plt count 108, today on 1/29 plt count 107   Anesthesia Other Findings   Reproductive/Obstetrics (+) Pregnancy                            Anesthesia Physical Anesthesia Plan  ASA: II  Anesthesia Plan: Spinal   Post-op Pain Management:    Induction:   Airway Management Planned:   Additional Equipment:   Intra-op Plan:   Post-operative Plan:   Informed Consent: I have reviewed the patients History and Physical, chart, labs and discussed the procedure including the risks, benefits and alternatives for the proposed anesthesia with the patient or authorized representative who has indicated his/her understanding and acceptance.   Dental advisory given  Plan Discussed with:   Anesthesia Plan Comments: (Discussed with Dr. Mora ApplPinn the indication for the repeat C/S. Patient has a history of 2 prior C/S the first of which was for failure to progress and the second was a repeat at term for  an intrauterine fetal demise. This patient at last at 8pm but Dr. Mora ApplPinn is calling this an indicated C/S with a goal of being in the OR within 1 hour and reports that we cannot wait for a full 8 hours of NPO status. I will give the patient pepcid, reglan, and bicitra as GI ppx and plan for a spinal anesthetic. The risks/benefits/alternatives were all explained to the patient and her husband and they expressed understanding and wished to proceed.)        Anesthesia Quick Evaluation

## 2014-07-29 NOTE — MAU Note (Signed)
PT  SAYS   SROM   AT 940PM   IS  [facility]  FOR [facility]   C/S  ON 08-04-2014.   CAME IN BY EMS

## 2014-07-29 NOTE — H&P (Signed)
Marigny Dozier is a 34 y.o. female presenting for SROM Patient w/o contractions, no vaginal bleeding, notes good fetal movement. Her h/o is significant for two cesarean deliveries last one for fetal demise at 39 weeks.   Maternal Medical History:  Reason for admission: Rupture of membranes.  Nausea.  Contractions: Onset was 1-2 hours ago.   Frequency: irregular.   Duration is approximately 45 seconds.   Perceived severity is mild.    Fetal activity: Perceived fetal activity is normal.   Last perceived fetal movement was within the past 12 hours.    Prenatal complications: Thrombocytopenia.   Prenatal Complications - Diabetes: none.    OB History    Gravida Para Term Preterm AB TAB SAB Ectopic Multiple Living   Past Medical History  Diagnosis Date  . PONV (postoperative nausea and vomiting)   . Anemia     history  . Headache(784.0)     otc med prn  . 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 Cesarean section  12/2008  . Wisdom tooth extraction    . Cesarean section N/A 08/24/2012    Procedure: CESAREAN SECTION;  Surgeon: Mickel Baas, MD;  Location: [facility];  Service: Obstetrics;  Laterality: N/A;   Family History: family history is not on file. Social History:  reports that she has never smoked. She has never used smokeless tobacco. She reports that she does not drink alcohol or use illicit drugs.   Prenatal Transfer Tool  Maternal Diabetes: No Genetic Screening: Normal Maternal Ultrasounds/Referrals: Normal Fetal Ultrasounds or other Referrals:  Other: anatomy scan normal Maternal Substance Abuse:  No Significant Maternal Medications:  None Significant Maternal Lab Results:  Lab values include: Group B Strep negative Other Comments:  None  Review of Systems  Constitutional: Negative for fever and chills.  Eyes: Negative for blurred vision and double  vision.  Respiratory: Negative for cough.   Cardiovascular: Negative for chest pain and palpitations.  Gastrointestinal: Negative for heartburn and nausea.  Skin: Negative for rash.  Neurological: Negative for dizziness and headaches.  All other systems reviewed and are negative.     Blood pressure 126/76, pulse 105, temperature 98.1 F (36.7 C), temperature source Oral, resp. rate 20, height  (1.676 m), weight 91.173 kg (201 lb). Maternal Exam:  Uterine Assessment: Contraction strength is mild.  Contraction duration is 45 seconds. Contraction frequency is irregular.   Abdomen: Patient reports no abdominal tenderness. Surgical scars: low transverse.   Fundal height is 37cm.   Estimated fetal weight is 2600 grams.   Fetal presentation: vertex  Introitus: Normal vulva. Normal vagina.  Ferning test: positive.  Nitrazine test: positive. Amniotic fluid character: clear.  Pelvis: of concern for delivery.   Cervix: Cervix evaluated by sterile speculum exam.     Fetal Exam Fetal Monitor Review: Mode: hand-held doppler probe.   Baseline rate: 125.  Variability: moderate (6-25 bpm).   Pattern: no decelerations and accelerations present.    Fetal State Assessment: Category I - tracings are normal.     Physical Exam  Vitals reviewed. Constitutional: She is oriented to person, place, and time. She appears well-developed and well-nourished.  HENT:  Head: Normocephalic.  Eyes: Pupils are equal, round, and reactive to light.  Neck: Normal range of motion.  Cardiovascular: Normal rate.   Respiratory: Effort normal.  GI: Soft.  Genitourinary: Vagina  normal.  Musculoskeletal: Normal range of motion.  Neurological: She is alert and oriented to person, place, and time. She has normal reflexes.  Skin: Skin is warm.    Prenatal labs: ABO, Rh: O/Positive/-- (08/04 0000) Antibody: Negative (08/04 0000) Rubella: Immune (08/04 0000) RPR: Nonreactive (08/04 0000)  HBsAg: Negative  (08/04 0000)  HIV: Non-reactive (08/04 0000)  GBS: Negative (01/15 0000)   Assessment/Plan: 34 yo G2P2001 at 37/4 h/o prior 2 cesarean deliveries PROM On call to OR for repeat CD Spinal anesthesia    Shellyann Wandrey STACIA 07/29/2014, 10:42 PM

## 2014-07-29 NOTE — Anesthesia Procedure Notes (Signed)
Spinal Patient location during procedure: OR Start time: 07/29/2014 11:29 PM Staffing Anesthesiologist: Karie SchwalbeJUDD, Ebony [name] Performed by: anesthesiologist  Preanesthetic Checklist Completed: patient identified, site marked, surgical consent, pre-op evaluation, timeout performed, IV checked, risks and benefits discussed and monitors and equipment checked Spinal Block Patient position: sitting Prep: ChloraPrep Patient monitoring: continuous pulse ox, blood pressure and heart rate Approach: midline Location: L3-4 Injection technique: single-shot Needle Needle type: Sprotte  Needle gauge: 24 G Needle length: 9 cm Assessment Sensory level: T4 Additional Notes .Functioning IV was confirmed and monitors were applied. Sterile prep and drape, including hand hygiene, mask and sterile gloves were used. The patient was positioned and the spine was prepped. The skin was anesthetized with lidocaine.  Free flow of clear CSF was obtained prior to injecting local anesthetic into the CSF.  The spinal needle aspirated freely following injection.  The needle was carefully withdrawn.  The patient tolerated the procedure well. Consent was obtained prior to procedure with all questions answered and concerns addressed. Risks including but not limited to bleeding, infection, nerve damage, paralysis, failed block, inadequate analgesia, allergic reaction, high spinal, itching and headache were discussed and the patient wished to proceed.   Karie SchwalbeMary Darshay Deupree, MD

## 2014-07-29 NOTE — MAU Note (Signed)
IN OR 

## 2014-07-30 ENCOUNTER — Encounter (HOSPITAL_COMMUNITY): Payer: Self-pay | Admitting: *Deleted

## 2014-07-30 ENCOUNTER — Encounter (HOSPITAL_COMMUNITY): Payer: Self-pay | Admitting: Anesthesiology

## 2014-07-30 DIAGNOSIS — Z98891 History of uterine scar from previous surgery: Secondary | ICD-10-CM

## 2014-07-30 LAB — CBC
HCT: 36.7 % (ref 36.0–46.0)
Hemoglobin: 12.5 g/dL (ref 12.0–15.0)
MCH: 31.6 pg (ref 26.0–34.0)
MCHC: 34.1 g/dL (ref 30.0–36.0)
MCV: 92.7 fL (ref 78.0–100.0)
PLATELETS: 104 10*3/uL — AB (ref 150–400)
RBC: 3.96 MIL/uL (ref 3.87–5.11)
RDW: 13.7 % (ref 11.5–15.5)
WBC: 11.5 10*3/uL — ABNORMAL HIGH (ref 4.0–10.5)

## 2014-07-30 MED ORDER — NALBUPHINE HCL 10 MG/ML IJ SOLN: 5.0000 mg | Freq: Once | INTRAMUSCULAR | Status: AC | PRN

## 2014-07-30 MED ORDER — ACETAMINOPHEN 500 MG PO TABS
1000.0000 mg | ORAL_TABLET | Freq: Four times a day (QID) | ORAL | Status: AC
  Administered 2014-07-30 (×3): 1000 mg via ORAL
  Filled 2014-07-30 (×3): qty 2

## 2014-07-30 MED ORDER — SENNOSIDES-DOCUSATE SODIUM 8.6-50 MG PO TABS
2.0000 | ORAL_TABLET | ORAL | Status: DC
Start: 2014-07-31 — End: 2014-08-02
  Administered 2014-07-30 – 2014-08-02 (×3): 2 via ORAL
  Filled 2014-07-30 (×2): qty 2

## 2014-07-30 MED ORDER — MEPERIDINE HCL 25 MG/ML IJ SOLN: 6.2500 mg | INTRAMUSCULAR | Status: DC | PRN

## 2014-07-30 MED ORDER — OXYTOCIN 40 UNITS IN LACTATED RINGERS INFUSION - SIMPLE MED
62.5000 mL/h | INTRAVENOUS | Status: AC
  Administered 2014-07-30: 40 [IU] via INTRAVENOUS

## 2014-07-30 MED ORDER — NALOXONE HCL 0.4 MG/ML IJ SOLN: 0.4000 mg | INTRAMUSCULAR | Status: DC | PRN

## 2014-07-30 MED ORDER — DIPHENHYDRAMINE HCL 50 MG/ML IJ SOLN: 12.5000 mg | INTRAMUSCULAR | Status: DC | PRN

## 2014-07-30 MED ORDER — ONDANSETRON HCL 4 MG/2ML IJ SOLN: 4.0000 mg | INTRAMUSCULAR | Status: DC | PRN

## 2014-07-30 MED ORDER — MENTHOL 3 MG MT LOZG: 1.0000 | LOZENGE | OROMUCOSAL | Status: DC | PRN

## 2014-07-30 MED ORDER — FENTANYL CITRATE 0.05 MG/ML IJ SOLN: 25.0000 ug | INTRAMUSCULAR | Status: DC | PRN

## 2014-07-30 MED ORDER — SIMETHICONE 80 MG PO CHEW
80.0000 mg | CHEWABLE_TABLET | ORAL | Status: DC
  Administered 2014-07-30 – 2014-08-02 (×3): 80 mg via ORAL
  Filled 2014-07-30 (×2): qty 1

## 2014-07-30 MED ORDER — DIPHENHYDRAMINE HCL 25 MG PO CAPS: 25.0000 mg | ORAL_CAPSULE | Freq: Four times a day (QID) | ORAL | Status: DC | PRN

## 2014-07-30 MED ORDER — ZOLPIDEM TARTRATE 5 MG PO TABS: 5.0000 mg | ORAL_TABLET | Freq: Every evening | ORAL | Status: DC | PRN

## 2014-07-30 MED ORDER — NALOXONE HCL 1 MG/ML IJ SOLN
1.0000 ug/kg/h | INTRAVENOUS | Status: DC | PRN
  Filled 2014-07-30: qty 2

## 2014-07-30 MED ORDER — LACTATED RINGERS IV SOLN
INTRAVENOUS | Status: DC
  Administered 2014-07-30: 18:00:00 via INTRAVENOUS

## 2014-07-30 MED ORDER — IBUPROFEN 600 MG PO TABS
600.0000 mg | ORAL_TABLET | Freq: Four times a day (QID) | ORAL | Status: DC
  Administered 2014-07-30 – 2014-08-02 (×12): 600 mg via ORAL
  Filled 2014-07-30 (×12): qty 1

## 2014-07-30 MED ORDER — DIBUCAINE 1 % RE OINT: 1.0000 "application " | TOPICAL_OINTMENT | RECTAL | Status: DC | PRN

## 2014-07-30 MED ORDER — OXYCODONE-ACETAMINOPHEN 5-325 MG PO TABS
1.0000 | ORAL_TABLET | ORAL | Status: DC | PRN
  Administered 2014-07-31 – 2014-08-02 (×6): 1 via ORAL
  Filled 2014-07-30 (×8): qty 1

## 2014-07-30 MED ORDER — ACETAMINOPHEN 10 MG/ML IV SOLN
1000.0000 mg | Freq: Once | INTRAVENOUS | Status: AC
  Administered 2014-07-30: 1000 mg via INTRAVENOUS
  Filled 2014-07-30: qty 100

## 2014-07-30 MED ORDER — SCOPOLAMINE 1 MG/3DAYS TD PT72
1.0000 | MEDICATED_PATCH | Freq: Once | TRANSDERMAL | Status: AC
  Administered 2014-07-30: 1.5 mg via TRANSDERMAL

## 2014-07-30 MED ORDER — WITCH HAZEL-GLYCERIN EX PADS: 1.0000 "application " | MEDICATED_PAD | CUTANEOUS | Status: DC | PRN

## 2014-07-30 MED ORDER — LACTATED RINGERS IV SOLN
40.0000 [IU] | INTRAVENOUS | Status: DC | PRN
  Administered 2014-07-29: 40 [IU] via INTRAVENOUS

## 2014-07-30 MED ORDER — TETANUS-DIPHTH-ACELL PERTUSSIS 5-2.5-18.5 LF-MCG/0.5 IM SUSP
0.5000 mL | Freq: Once | INTRAMUSCULAR | Status: AC
  Administered 2014-07-31: 0.5 mL via INTRAMUSCULAR
  Filled 2014-07-30: qty 0.5

## 2014-07-30 MED ORDER — ONDANSETRON HCL 4 MG/2ML IJ SOLN
INTRAMUSCULAR | Status: DC | PRN
  Administered 2014-07-29: 4 mg via INTRAVENOUS

## 2014-07-30 MED ORDER — NALBUPHINE HCL 10 MG/ML IJ SOLN: 5.0000 mg | INTRAMUSCULAR | Status: DC | PRN

## 2014-07-30 MED ORDER — ONDANSETRON HCL 4 MG/2ML IJ SOLN: 4.0000 mg | Freq: Once | INTRAMUSCULAR | Status: DC | PRN

## 2014-07-30 MED ORDER — OXYTOCIN 40 UNITS IN LACTATED RINGERS INFUSION - SIMPLE MED
INTRAVENOUS | Status: AC
  Administered 2014-07-30: 40 [IU] via INTRAVENOUS
  Filled 2014-07-30: qty 1000

## 2014-07-30 MED ORDER — SODIUM CHLORIDE 0.9 % IJ SOLN
3.0000 mL | INTRAMUSCULAR | Status: DC | PRN
Start: 2014-07-30 — End: 2014-08-02

## 2014-07-30 MED ORDER — LANOLIN HYDROUS EX OINT: 1.0000 "application " | TOPICAL_OINTMENT | CUTANEOUS | Status: DC | PRN

## 2014-07-30 MED ORDER — INFLUENZA VAC SPLIT QUAD 0.5 ML IM SUSY
0.5000 mL | PREFILLED_SYRINGE | INTRAMUSCULAR | Status: AC
  Administered 2014-07-31: 0.5 mL via INTRAMUSCULAR
  Filled 2014-07-30: qty 0.5

## 2014-07-30 MED ORDER — MEPERIDINE HCL 25 MG/ML IJ SOLN
INTRAMUSCULAR | Status: DC | PRN
  Administered 2014-07-30 (×2): 12.5 mg via INTRAVENOUS

## 2014-07-30 MED ORDER — SIMETHICONE 80 MG PO CHEW
80.0000 mg | CHEWABLE_TABLET | Freq: Three times a day (TID) | ORAL | Status: DC
  Administered 2014-07-30 – 2014-08-02 (×10): 80 mg via ORAL
  Filled 2014-07-30 (×9): qty 1

## 2014-07-30 MED ORDER — SIMETHICONE 80 MG PO CHEW: 80.0000 mg | CHEWABLE_TABLET | ORAL | Status: DC | PRN

## 2014-07-30 MED ORDER — PRENATAL MULTIVITAMIN CH
1.0000 | ORAL_TABLET | Freq: Every day | ORAL | Status: DC
  Administered 2014-07-30 – 2014-08-02 (×4): 1 via ORAL
  Filled 2014-07-30 (×4): qty 1

## 2014-07-30 MED ORDER — ONDANSETRON HCL 4 MG PO TABS: 4.0000 mg | ORAL_TABLET | ORAL | Status: DC | PRN

## 2014-07-30 MED ORDER — DIPHENHYDRAMINE HCL 25 MG PO CAPS: 25.0000 mg | ORAL_CAPSULE | ORAL | Status: DC | PRN

## 2014-07-30 MED ORDER — OXYCODONE-ACETAMINOPHEN 5-325 MG PO TABS
2.0000 | ORAL_TABLET | ORAL | Status: DC | PRN
  Administered 2014-07-31 – 2014-08-01 (×7): 2 via ORAL
  Filled 2014-07-30 (×8): qty 2

## 2014-07-30 MED ORDER — SCOPOLAMINE 1 MG/3DAYS TD PT72
MEDICATED_PATCH | TRANSDERMAL | Status: AC
  Filled 2014-07-30: qty 1

## 2014-07-30 MED ORDER — MEPERIDINE HCL 25 MG/ML IJ SOLN
INTRAMUSCULAR | Status: AC
  Filled 2014-07-30: qty 1

## 2014-07-30 MED ORDER — ONDANSETRON HCL 4 MG/2ML IJ SOLN: 4.0000 mg | Freq: Three times a day (TID) | INTRAMUSCULAR | Status: DC | PRN

## 2014-07-30 NOTE — Lactation Note (Signed)
This note was copied from the chart of Ebony Alivea Duhart. Lactation Consultation Note  Initial visit made.  Breastfeeding consultation services and support information given to patient and reviewed.  Mom states her first baby never latched.  Baby is 9 hours old and recently was fed formula because baby would not latch per mom.  Baby is currently sleeping in crib.  Instructed mom to call out with feeding cues for latch assist.  Patient Name: Ebony [name] JYNWG'NToday's Date: 07/30/2014 Reason for consult: Initial assessment   Maternal Data Does the patient have breastfeeding experience prior to this delivery?: No  Feeding    LATCH Score/Interventions                      Lactation Tools Discussed/Used     Consult Status Consult Status: Follow-up    Huston FoleyMOULDEN, Gerlad Pelzel S 07/30/2014, 9:35 AM

## 2014-07-30 NOTE — Lactation Note (Signed)
This note was copied from the chart of Ebony [name]. Lactation Consultation Note  Patient Name: Ebony [name] WUJWJ'XToday's Date: 07/30/2014 Reason for consult: Follow-up assessment  Baby 18 hours of life. Called to assist with latching baby. Mom states that she wants to nurse and formula-feed for first 6 months. Discussed supply and demand with mom. Offered to assist with latching, but mom states that she doesn't really want to latch right now because she is sore from surgery. Discussed latching in football position. Mom states that she may call later for assistance with latching as she cannot decide for sure what she wants to do. Mom states her first child never really latched. Enc mom to call for assistance as needed with latching/nursing. Maternal Data    Feeding    LATCH Score/Interventions                      Lactation Tools Discussed/Used     Consult Status Consult Status: PRN    Geralynn OchsWILLIARD, Riverlyn Kizziah 07/30/2014, 6:16 PM

## 2014-07-30 NOTE — Op Note (Signed)
Cesarean Section Procedure Note  Indications: previous uterine incision kerr x2   Pre-operative Diagnosis: 37 week 4 day pregnancy, prior LTCS with SROM  Post-operative Diagnosis: same  Surgeon: Wynonia HazardPINN, Sedrick Tober STACIA   Assistants: none  Anesthesia: Spinal anesthesia  ASA Class: 2  Procedure Details   The patient was counseled about the risks, benefits, complications of the cesarean section. The patient concurred with the proposed plan, giving informed consent.  The site of surgery properly noted/marked. The patient was taken to Operating Room # 9, identified as Ebony [name] and the procedure verified as C-Section Delivery. A Time Out was held and the above information confirmed.  After epidural was found to adequate , the patient was placed in the dorsal supine position with a leftward tilt, draped and prepped in the usual sterile manner. A Pfannenstiel incision was made with a 10 blade scalpel and carried down through the subcutaneous tissue to the fascia.  The fascia was incised in the midline and the fascial incision was extended laterally with Mayo scissors. The superior aspect of the fascial incision was grasped with Coker clamps x2, tented up and the rectus muscles dissected off sharply with the scalpel. The rectus was then dissected off with blunt dissection and Mayo scissors inferiorly. The rectus muscles were separated in the midline. The abdominal peritoneum was identified, tented up, entered sharply, and the incision was extended superiorly and inferiorly with good visualization of the bladder. The Alexis retractor was then deployed. Scalpel was then used to make a low transverse incision on the uterus which was extended laterally with  blunt dissection. The fetal vertex was identified, with help from a nurse pushing up on the head vaginally, the head was brought out from the pelvis. A Mity Vacuum was placed to aid in delivery of the vertex.  The head was then delivered easily through  the uterine incision followed by the body. The A live female infant was bulb suctioned on the operative field cried vigorously, cord was clamped and cut and the infant was passed to the waiting neonatologist. Apgars 8/9. Placenta was then delivered spontaneously, intact and appear normal, the uterus was cleared of all clot and debris. The uterine incision was repaired with #0 Monocryl in running locked fashion. A second imbricating suture was performed using the same suture. The incision was hemostatic. Ovaries and tubes were inspected and normal. The Alexis retractor was removed. The abdominal cavity was cleared of all clot and debris. The abdominal peritoneum was reapproximated with 2-0 chromic  in a running fashion, the rectus muscles was reapproximated with #2 chromic in interrupted fashion. The fascia was closed with 0 Vicryl in a running fashion. The subcuticular layer was irrigated and all bleeders cauterized.  The subcutaneous layer was re-approximated with interrupted suture of 2-0 plain.   The skin was closed with 4-0 vicryl in a subcuticular fashion using a Mellody DanceKeith needle. The incision was dressed with benzoine, steri strips and pressure dressing. All sponge lap and needle counts were correct x3. Patient tolerated the procedure well and recovered in stable condition following the procedure.  Instrument, sponge, and needle counts were correct prior the abdominal closure and at the conclusion of the case.   Findings: Live female infant, Apgars 8/9, clear amniotic fluid, normal appearing placenta, normal uterus, bilateral tubes and ovaries  Estimated Blood Loss: 600mL         Drains: Foley catheter 350 cc clear urine at end of procedure         Specimens: Placenta  to L&D         Implants: none         Complications:  None; patient tolerated the procedure well.         Disposition: PACU - hemodynamically stable.   Braison Snoke STACIA

## 2014-07-30 NOTE — Progress Notes (Signed)
Subjective: Postpartum Day 0-1: Cesarean Delivery Patient reports incisional pain and tolerating PO.    Objective: Vital signs in last 24 hours: Temp:  [97.6 F (36.4 C)-99 F (37.2 C)] 99 F (37.2 C) (01/30 0800) Pulse Rate:  [61-105] 80 (01/30 0800) Resp:  [17-25] 18 (01/30 0800) BP: (90-126)/(31-80) 95/53 mmHg (01/30 0800) SpO2:  [95 %-99 %] 96 % (01/30 0800) Weight:  [91.173 kg (201 lb)] 91.173 kg (201 lb) (01/29 2253)  Physical Exam:  General: alert, cooperative and no distress Lochia: appropriate Uterine Fundus: firm Incision: healing well, no significant drainage, no dehiscence dressing in place DVT Evaluation: No evidence of DVT seen on physical exam. Negative Homan's sign. No cords or calf tenderness.   Recent Labs  07/29/14 2240 07/30/14 0500  HGB 13.6 12.5  HCT 40.0 36.7    Assessment/Plan: Status post Cesarean section. Doing well postoperatively.  D/C IV fluids and foley. Encourage ambulation today  Ebony [name], Ebony [name] 07/30/2014, 9:59 AM

## 2014-07-30 NOTE — Progress Notes (Signed)
Acknowledged order for social work consult to assess mother's hx of depression.  Met with both parents.  They were pleasant and receptive to CSW.  Parents are married and have one other dependent age 36.   Mother denied any hx of depression.  Parents declined social work consult.  They seemed very excited about newborn.  No social concerns related or observed at this time.

## 2014-07-30 NOTE — Transfer of Care (Signed)
Immediate Anesthesia Transfer of Care Note  Patient: Ebony [name]  Procedure(s) Performed: Procedure(s): CESAREAN SECTION (N/A)  Patient Location: PACU  Anesthesia Type:Spinal  Level of Consciousness: awake, alert  and oriented  Airway & Oxygen Therapy: Patient Spontanous Breathing  Post-op Assessment: Report given to RN and Post -op Vital signs reviewed and stable  Post vital signs: Reviewed and stable  Last Vitals:  Filed Vitals:   07/29/14 2207  BP: 126/76  Pulse: 105  Temp: 36.7 C  Resp: 20    Complications: No apparent anesthesia complications

## 2014-07-30 NOTE — Anesthesia Postprocedure Evaluation (Signed)
  Anesthesia Post-op Note  Patient: Ebony [name]  Procedure(s) Performed: Procedure(s) (LRB): CESAREAN SECTION (N/A)  Patient Location: PACU  Anesthesia Type: Spinal  Level of Consciousness: awake and alert   Airway and Oxygen Therapy: Patient Spontanous Breathing  Post-op Pain: mild  Post-op Assessment: Post-op Vital signs reviewed, Patient's Cardiovascular Status Stable, Respiratory Function Stable, Patent Airway and No signs of Nausea or vomiting  Last Vitals:  Filed Vitals:   07/30/14 0115  BP: 109/79  Pulse: 61  Temp:   Resp: 20    Post-op Vital Signs: stable   Complications: No apparent anesthesia complications

## 2014-07-31 NOTE — Progress Notes (Signed)
Subjective: Postpartum Day 2: Cesarean Delivery Patient reports tolerating PO, + flatus and no problems voiding.    Objective: Vital signs in last 24 hours: Temp:  [98 F (36.7 C)-99.2 F (37.3 C)] 98 F (36.7 C) (01/31 0500) Pulse Rate:  [71-85] 85 (01/31 0500) Resp:  [18] 18 (01/31 0500) BP: (95-117)/(50-71) 117/71 mmHg (01/31 0500) SpO2:  [96 %-99 %] 97 % (01/30 2348)  Physical Exam:  General: alert, cooperative and no distress Lochia: appropriate Uterine Fundus: firm Incision: healing well, no significant drainage, no dehiscence, no significant erythema DVT Evaluation: No evidence of DVT seen on physical exam. Negative Homan's sign. No cords or calf tenderness.   Recent Labs  07/29/14 2240 07/30/14 0500  HGB 13.6 12.5  HCT 40.0 36.7    Assessment/Plan: Status post Cesarean section. Doing well postoperatively.  Continue current care. Discharge tomorrow  Essie HartINN, Naraya Stoneberg [facility] Hospital[facility]ACIA 07/31/2014, 10:52 AM

## 2014-08-01 ENCOUNTER — Inpatient Hospital Stay (HOSPITAL_COMMUNITY)
Admit: 2014-08-01 | Discharge: 2014-08-01 | Disposition: A | Discharge status: Home / Self Care | Payer: Medicaid Other | Attending: Obstetrics and Gynecology | Admitting: Obstetrics and Gynecology

## 2014-08-01 ENCOUNTER — Encounter (HOSPITAL_COMMUNITY): Payer: Self-pay | Admitting: *Deleted

## 2014-08-01 LAB — RPR: RPR Ser Ql: NONREACTIVE

## 2014-08-01 MED ORDER — OXYCODONE-ACETAMINOPHEN 5-325 MG PO TABS: 2.0000 | ORAL_TABLET | ORAL | Status: DC | PRN

## 2014-08-01 MED ORDER — LORAZEPAM 1 MG PO TABS
0.5000 mg | ORAL_TABLET | ORAL | Status: AC | PRN
Start: 2014-08-01 — End: 2014-08-01
  Administered 2014-08-01 (×2): 0.5 mg via ORAL
  Filled 2014-08-01: qty 1

## 2014-08-01 MED ORDER — CYCLOBENZAPRINE HCL 10 MG PO TABS
10.0000 mg | ORAL_TABLET | Freq: Once | ORAL | Status: AC
  Administered 2014-08-01: 10 mg via ORAL
  Filled 2014-08-01: qty 1

## 2014-08-01 MED ORDER — LORAZEPAM 1 MG PO TABS
0.5000 mg | ORAL_TABLET | Freq: Once | ORAL | Status: DC
  Filled 2014-08-01: qty 1

## 2014-08-01 NOTE — Progress Notes (Signed)
Patient is eating, ambulating, voiding.  Pt concerned about increased swelling in bilateral lower extremities with associated pain.  Also complaining of left sided hip pain, especially with walking.  Otherwise no issues.  Filed Vitals:   07/30/14 2348 07/31/14 0500 07/31/14 1800 08/01/14 0619  BP: 101/54 117/71 120/64 111/62  Pulse: 80 85 79 79  Temp: 98.1 F (36.7 C) 98 F (36.7 C) 98.2 F (36.8 C) 97.7 F (36.5 C)  TempSrc: Oral Oral Oral Oral  Resp: 18 18 18 18   Height:      Weight:      SpO2: 97%       Fundus firm Perineum without swelling. Inc: c/d/i Ext: no CT, +2 edema  Lab Results  Component Value Date   WBC 11.5* 07/30/2014   HGB 12.5 07/30/2014   HCT 36.7 07/30/2014   MCV 92.7 07/30/2014   PLT 104* 07/30/2014    --/--/O POS (01/29 2240)  A/P Post op day #2 s/p repeat c/s x 3. Advised pt swelling often worse after delivery and should improve in coming days.  Offered that pt could stay additional day but pt states she preferred to go home.    Later called by RN, states pt reporting increased pain on left side and numbness of inner left thigh.  RN spoke with anesthesia.  Dr. Brayton CavesFreeman Jackson called me and recommended MRI of lumbar spine.  Later spoke with Dr. Cristela BlueKyle Jackson who assessed pt and stated he felt very low likelihood of spinal hematoma.  Pt with full range of motion, no motor deficit.  MRI ordered and awaiting results.  Advised pt and husband that if MRI normal, likely do to pregnancy related musculoskeletal pain.  May consider outpatient PT.  Philip AspenALLAHAN, Javid Kemler

## 2014-08-01 NOTE — Lactation Note (Signed)
This note was copied from the chart of Ebony [name]. Lactation Consultation Note I went to patient room per mom's request.  Mom was not able to speak with IBCLC at this point related to her medical condition.  Encouraged parents to call for assistance when they were ready. Patient Name: Ebony [name] [id number]'QToday's Date: 08/01/2014     Maternal Data    Feeding Feeding Type: Bottle Fed - Formula  LATCH Score/Interventions                      Lactation Tools Discussed/Used     Consult Status      Soyla DryerJoseph, Ebony [name] 08/01/2014, 3:58 PM

## 2014-08-01 NOTE — Progress Notes (Signed)
Called for patient with worsening pain in leg and new numbness.  Called primary and recommend stat MRI to evaluate etiology.

## 2014-08-01 NOTE — Discharge Summary (Signed)
Obstetric Discharge Summary Reason for Admission: rupture of membranes Prenatal Procedures: ultrasound Intrapartum Procedures: cesarean: low cervical, transverse Postpartum Procedures: none Complications-Operative and Postpartum: none HEMOGLOBIN  Date Value Ref Range Status  07/30/2014 12.5 12.0 - 15.0 g/dL Final   HGB  Date Value Ref Range Status  07/26/2014 14.0 11.6 - 15.9 g/dL Final   HCT  Date Value Ref Range Status  07/30/2014 36.7 36.0 - 46.0 % Final  07/26/2014 40.8 34.8 - 46.6 % Final    Physical Exam:  General: alert and cooperative Lochia: appropriate Uterine Fundus: firm Incision: healing well, no significant drainage DVT Evaluation: No evidence of DVT seen on physical exam.  Discharge Diagnoses: Term Pregnancy-delivered  Discharge Information: Date: 08/01/2014 Activity: pelvic rest Diet: routine Medications: PNV, Ibuprofen and Percocet Condition: stable Instructions: refer to practice specific booklet Discharge to: home Follow-up Information    Follow up with PINN, Sanjuana MaeWALDA STACIA, MD In 2 weeks.   Specialty:  Obstetrics and Gynecology   Contact information:   [address] [place] [id number] [phone]       Newborn Data: Live born female  Birth Weight: 7 lb 4.8 oz (3310 g) APGAR: 8, 9  Home with mother.  Ebony [name] 08/01/2014, 10:08 AM

## 2014-08-01 NOTE — Progress Notes (Signed)
Ur chart review completed.  

## 2014-08-01 NOTE — Progress Notes (Signed)
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  Will consult orthopedic surgery for further evaluation and recommendations.

## 2014-08-01 NOTE — Progress Notes (Signed)
[facility], report from Hazel SamsMary Early, RN that [facility]. Spoke with Dr. Claiborne Billingsallahan about MRI results and pt c/o pain of 10/10. Was advised by Dr. Claiborne Billingsallahan to continue giving 2 Percocet q 4 hours, and touch base later if any issues arise. Per Dr. Claiborne Billingsallahan, pt to stay through tonight and possible d/c tomorrow. Sherald BargeMatthews, Caleigh Rabelo L

## 2014-08-02 ENCOUNTER — Other Ambulatory Visit: Payer: Medicaid Other

## 2014-08-02 NOTE — Lactation Note (Signed)
This note was copied from the chart of Ebony [name]. Lactation Consultation Note [name] been using DEBP to pre-pump to pull inverted nipples out. Pulls out well for latching. Has shells in rm. But didn't have them on. Encouraged to wear to assist nipple in everting more. Hand pump given to take home to use. Doesn't have DEBP, offered to rent or purchase one here. Stated she was going to get one after she went home. She would use the hand pump until then. Offered to come for f/u appt. To see LC as out pt. To see how BF was going and stated she was fine that she would call if needed to come and see us. Since she is doing breast and bottle, I'm not sure how committed she is to BF. Explained supply and demand, engorgement and supplementing depleating milk supply. Offered to assist in BF to watch her before d/c home. Stated I'm really doing good. Stressed the fact that her nipple pulls out well and baby can latch well. Reminded of support groups that are free. Patient Name: Ebony Luretha RuedDehbia [name] JXBJY'NToday's Date: 08/02/2014 Reason for consult: Follow-up assessment   Maternal Data Has patient been taught Hand Expression?: Yes  Feeding    LATCH Score/Interventions                      Lactation Tools Discussed/Used Tools: Shells;Pump;Comfort gels Shell Type: Inverted Breast pump type: Manual Pump Review: Setup, frequency, and cleaning;Milk Storage Initiated by:: Peri JeffersonL. Aryn Safran RN Date initiated:: 08/02/14   Consult Status Consult Status: Complete Date: 08/02/14 Follow-up type: Call as needed    Netanel Yannuzzi, Diamond NickelLAURA G 08/02/2014, 9:22 AM

## 2014-08-02 NOTE — Progress Notes (Addendum)
Spoke with Dr. Jordan LikesPool of Neurosurgery.  He reviewed MRI and pt does have significant narrowing secondary bulging disc at L4-L5.  He does not feel that this is an emergency and can be dealt with in one to two weeks of the postpartum period.  Her pain will be the most significant thing to deal with and he will go over all options at an outpatient visit in 1 week.  I discussed this with the patient and she voiced understanding.

## 2014-08-02 NOTE — Progress Notes (Addendum)
Dr. Eulah PontMurphy from Orthopedic surgery notified for consult to see this patient. He Instructed that OB/GYN of patient to call for a neurosurgery consult because Orthopedics doesn't see patients with spine issues. Will contact [facility] to call OB/GYN for consult.  Lucy ChrisJaime Layn Kye, RN  Dr. Henderson CloudHorvath notified and aware, will call & consult Neurosurgery.

## 2014-08-02 NOTE — Progress Notes (Signed)
  Patient is eating, ambulating, voiding.  Pain control is good.  Filed Vitals:   07/31/14 0500 07/31/14 1800 08/01/14 0619 08/02/14 0546  BP: 117/71 120/64 111/62 114/63  Pulse: 85 79 79 81  Temp: 98 F (36.7 C) 98.2 F (36.8 C) 97.7 F (36.5 C) 98.4 F (36.9 C)  TempSrc: Oral Oral Oral Oral  Resp: 18 18 18 18   Height:      Weight:      SpO2:    97%    lungs:   clear to auscultation cor:    RRR Abdomen:  soft, appropriate tenderness, incisions intact and without erythema or exudate ex:    no cords   Lab Results  Component Value Date   WBC 11.5* 07/30/2014   HGB 12.5 07/30/2014   HCT 36.7 07/30/2014   MCV 92.7 07/30/2014   PLT 104* 07/30/2014    --/--/O POS (01/29 2240)/RI  A/P    Post operative day 4.  Routine post op and postpartum care.  Expect d/c today.  Percocet for pain control. Pt's back and leg pain is secondary bulging disc at L4-5.  Ortho consulted- will d/c if ok with them.

## 2014-08-03 ENCOUNTER — Inpatient Hospital Stay (HOSPITAL_COMMUNITY): Admission: RE | Admit: 2014-08-03 | Payer: Medicaid Other | Source: Ambulatory Visit

## 2014-08-04 ENCOUNTER — Inpatient Hospital Stay (HOSPITAL_COMMUNITY)
Admission: RE | Admit: 2014-08-04 | Payer: Medicaid Other | Source: Ambulatory Visit | Admitting: Obstetrics and Gynecology

## 2014-08-04 SURGERY — Surgical Case
Anesthesia: Regional

## 2014-08-05 ENCOUNTER — Inpatient Hospital Stay (HOSPITAL_COMMUNITY)
Admission: AD | Admit: 2014-08-05 | Discharge: 2014-08-05 | Disposition: A | Discharge status: Home / Self Care | Payer: Medicaid Other | Source: Ambulatory Visit | Attending: Obstetrics | Admitting: Obstetrics

## 2014-08-05 ENCOUNTER — Encounter (HOSPITAL_COMMUNITY): Payer: Self-pay | Admitting: *Deleted

## 2014-08-05 DIAGNOSIS — G8918 Other acute postprocedural pain: Secondary | ICD-10-CM | POA: Insufficient documentation

## 2014-08-05 DIAGNOSIS — O9089 Other complications of the puerperium, not elsewhere classified: Secondary | ICD-10-CM | POA: Insufficient documentation

## 2014-08-05 LAB — URINALYSIS, ROUTINE W REFLEX MICROSCOPIC
Bilirubin Urine: NEGATIVE
Glucose, UA: NEGATIVE mg/dL
KETONES UR: NEGATIVE mg/dL
LEUKOCYTES UA: NEGATIVE
Nitrite: NEGATIVE
Protein, ur: NEGATIVE mg/dL
Urobilinogen, UA: 0.2 mg/dL (ref 0.0–1.0)
pH: 6 (ref 5.0–8.0)

## 2014-08-05 LAB — URINE MICROSCOPIC-ADD ON

## 2014-08-05 MED ORDER — OXYCODONE-ACETAMINOPHEN 5-325 MG PO TABS
1.0000 | ORAL_TABLET | Freq: Four times a day (QID) | ORAL | Status: DC | PRN
Start: 2014-08-05 — End: 2017-09-09

## 2014-08-05 NOTE — Discharge Instructions (Signed)

## 2014-08-05 NOTE — MAU Provider Note (Signed)
History     CSN: [id number]  Arrival date and time: 08/05/14 1851   First Provider Initiated Contact with Patient 08/05/14 2029      Chief Complaint  Patient presents with  . Drainage from Incision   HPI Ebony [name] 34 y.o. [id number] delivered by c-section on 1/29.  She was discharge 2/2 and began having bleeding and pain from incision site on 2/3.  Her pain has been well controlled with 2 oxycodone ever 4 hours and 600mg  ibuprofen every 6 hours.  She is also treating back pain from a disc problem.  She has noticed bleeding within the dressing.  She denies nausea, vomiting, trouble eating, dysuria, trouble with bowel movements, trouble with breastfeeding, fever, weakness.   OB History    Gravida Para Term Preterm AB TAB SAB Ectopic Multiple Living   3 3 3       0 2      Past Medical History  Diagnosis Date  . PONV (postoperative nausea and vomiting)   . Anemia     history  . Headache(784.0)     otc med prn  . 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 Cesarean section  12/2008  . Wisdom tooth extraction    . Cesarean section N/A 08/24/2012    Procedure: CESAREAN SECTION;  Surgeon: Mickel Baasichard D Kaplan, MD;  Location: [facility];  Service: Obstetrics;  Laterality: N/A;  . Cesarean section N/A 07/29/2014    Procedure: CESAREAN SECTION;  Surgeon: Essie HartWalda Pinn, MD;  Location: [facility];  Service: Obstetrics;  Laterality: N/A;    History reviewed. No pertinent family history.  History  Substance Use Topics  . Smoking status: Never Smoker   . Smokeless tobacco: Never Used  . Alcohol Use: No    Allergies: No Known Allergies  Prescriptions prior to admission  Medication Sig Dispense Refill Last Dose  . acetaminophen (TYLENOL) 325 MG tablet Take 325 mg by mouth daily as needed for pain (headache).    Past Month at Unknown time  . ibuprofen (ADVIL,MOTRIN) 200 MG tablet Take 600 mg by mouth every 6  (six) hours as needed for moderate pain.   08/05/2014 at Unknown time  . oxyCODONE-acetaminophen (PERCOCET/ROXICET) 5-325 MG per tablet Take 2 tablets by mouth every 4 (four) hours as needed (for pain scale equal to or greater than 7). 30 tablet 0 08/05/2014 at Unknown time  . Prenatal Vit-Fe Fumarate-FA (PRENATAL MULTIVITAMIN) TABS tablet Take 1 tablet by mouth daily at 12 noon.   Past Month at Unknown time    ROS Pertinent ROS in HPI  Physical Exam   Blood pressure 135/89, pulse 71, temperature 98.2 F (36.8 C), temperature source Oral, resp. rate 16, unknown if currently breastfeeding.  Physical Exam  Constitutional: She is oriented to person, place, and time. She appears well-developed and well-nourished. No distress.  HENT:  Head: Normocephalic and atraumatic.  Eyes: EOM are normal.  Neck: Normal range of motion.  Cardiovascular: Normal rate and regular rhythm.   Respiratory: Effort normal and breath sounds normal. No respiratory distress.  GI: Soft. Bowel sounds are normal. She exhibits no distension. There is no tenderness. There is no rebound and no guarding.  After removal of dressing, incision appears to be well-healing.  Steristrips left in place.  No draining.  No marked erythema or induration.  Musculoskeletal: Normal range of motion.  Neurological: She is alert and oriented to person, place, and time.  Skin:  Skin is warm and dry.  Psychiatric: She has a normal mood and affect.    MAU Course  Procedures  MDM Discussed with Dr. Chestine Spore.  She advises to remove dressing and okay to give additional pain medication: oxycodone #30, 1-2 q 6 hours.  Okay for discharge.   With removal of dressing, 3 steri-strips noted to be secured to honeycomb.  These are cut and additional steristrips placed.  No signs of infection or problem  Assessment and Plan  A:  1. Post-operative pain    P: Discharge to home Percocet 1-2 q 6 hours.  #30.  No refills Pt to receive all further pain  medication from office No need to apply further dressing but encouraged to wear underwear that are loose-fitting and those that do not have elastic across incision. Follow up in clinic Patient may return to MAU as needed or if her condition were to change or worsen   Bertram Denver 08/05/2014, 8:29 PM

## 2014-08-05 NOTE — MAU Note (Signed)
C/S on 1/29, noted bleeding from incision 2 days ago, dressing intact, blood noted on dressing.  Pt states she still has a lot of incisional & back pain.  Has small amount of vaginal bleeding.

## 2014-09-20 ENCOUNTER — Ambulatory Visit: Payer: Medicaid Other | Admitting: Hematology and Oncology

## 2014-09-20 ENCOUNTER — Other Ambulatory Visit: Payer: Medicaid Other

## 2015-10-12 ENCOUNTER — Inpatient Hospital Stay (HOSPITAL_COMMUNITY)
Admission: AD | Admit: 2015-10-12 | Discharge: 2015-10-12 | Disposition: A | Discharge status: Home / Self Care | Payer: Medicaid Other | Source: Ambulatory Visit | Attending: Obstetrics | Admitting: Obstetrics

## 2015-10-12 ENCOUNTER — Encounter (HOSPITAL_COMMUNITY): Payer: Self-pay | Admitting: *Deleted

## 2015-10-12 ENCOUNTER — Inpatient Hospital Stay (HOSPITAL_COMMUNITY): Payer: Medicaid Other

## 2015-10-12 DIAGNOSIS — O119 Pre-existing hypertension with pre-eclampsia, unspecified trimester: Secondary | ICD-10-CM | POA: Diagnosis not present

## 2015-10-12 DIAGNOSIS — O26891 Other specified pregnancy related conditions, first trimester: Secondary | ICD-10-CM | POA: Insufficient documentation

## 2015-10-12 DIAGNOSIS — R109 Unspecified abdominal pain: Secondary | ICD-10-CM | POA: Insufficient documentation

## 2015-10-12 DIAGNOSIS — Z3A01 Less than 8 weeks gestation of pregnancy: Secondary | ICD-10-CM | POA: Insufficient documentation

## 2015-10-12 DIAGNOSIS — O3680X0 Pregnancy with inconclusive fetal viability, not applicable or unspecified: Secondary | ICD-10-CM

## 2015-10-12 DIAGNOSIS — O4691 Antepartum hemorrhage, unspecified, first trimester: Secondary | ICD-10-CM

## 2015-10-12 DIAGNOSIS — O209 Hemorrhage in early pregnancy, unspecified: Secondary | ICD-10-CM | POA: Insufficient documentation

## 2015-10-12 HISTORY — DX: Essential (primary) hypertension: I10

## 2015-10-12 LAB — CBC
HEMATOCRIT: 36.6 % (ref 36.0–46.0)
HEMOGLOBIN: 12.3 g/dL (ref 12.0–15.0)
MCH: 28.9 pg (ref 26.0–34.0)
MCHC: 33.6 g/dL (ref 30.0–36.0)
MCV: 85.9 fL (ref 78.0–100.0)
Platelets: 125 10*3/uL — ABNORMAL LOW (ref 150–400)
RBC: 4.26 MIL/uL (ref 3.87–5.11)
RDW: 13.7 % (ref 11.5–15.5)
WBC: 10.8 10*3/uL — AB (ref 4.0–10.5)

## 2015-10-12 LAB — URINALYSIS, ROUTINE W REFLEX MICROSCOPIC
Bilirubin Urine: NEGATIVE
Glucose, UA: NEGATIVE mg/dL
Ketones, ur: NEGATIVE mg/dL
Leukocytes, UA: NEGATIVE
Nitrite: NEGATIVE
Protein, ur: NEGATIVE mg/dL
Specific Gravity, Urine: 1.02 (ref 1.005–1.030)
pH: 6 (ref 5.0–8.0)

## 2015-10-12 LAB — WET PREP, GENITAL
CLUE CELLS WET PREP: NONE SEEN
SPERM: NONE SEEN
TRICH WET PREP: NONE SEEN
YEAST WET PREP: NONE SEEN

## 2015-10-12 LAB — HCG, QUANTITATIVE, PREGNANCY: hCG, Beta Chain, Quant, S: 163 m[IU]/mL — ABNORMAL HIGH (ref <5)

## 2015-10-12 LAB — URINE MICROSCOPIC-ADD ON

## 2015-10-12 LAB — POCT PREGNANCY, URINE: Preg Test, Ur: POSITIVE — AB

## 2015-10-12 NOTE — MAU Note (Signed)
Pt started having vag bleeding and cramping that stared yesterday. Bleeding heavier today.

## 2015-10-12 NOTE — Discharge Instructions (Signed)

## 2015-10-12 NOTE — MAU Provider Note (Signed)
History     CSN: [id number]  Arrival date and time: 10/12/15 1645   None     Chief Complaint  Patient presents with  . Abdominal Pain  . Vaginal Bleeding   HPI  Pt is [id number] @ 2277w2d pregnant- presents with vaginal bleeding and cramping that started last night.  [facility]  Bleeding is heavier today Rn note: Madaline SavageKathrine M Wilson, RN (Registered Nurse)     Expand All Collapse All   Pt started having vag bleeding and cramping that stared yesterday. Bleeding heavier today.       Past Medical History  Diagnosis Date  . Hypertension     gestational    Past Surgical History  Procedure Laterality Date  . Cesarean section      History reviewed. No pertinent family history.  Social History  Substance Use Topics  . Smoking status: Never Smoker   . Smokeless tobacco: Never Used  . Alcohol Use: No    Allergies: Not on File  No prescriptions prior to admission    Review of Systems  Constitutional: Negative for fever and chills.  Gastrointestinal: Positive for abdominal pain. Negative for nausea, vomiting, diarrhea and constipation.  Genitourinary: Negative for dysuria.   Physical Exam   Blood pressure 127/70, pulse 89, temperature 98.7 F (37.1 C), temperature source Oral, resp. rate 18, last menstrual period 08/29/2015.  Physical Exam  Nursing note and vitals reviewed. Constitutional: She is oriented to person, place, and time. She appears well-developed and well-nourished. No distress.  HENT:  Head: Normocephalic.  Eyes: Pupils are equal, round, and reactive to light.  Neck: Normal range of motion. Neck supple.  Cardiovascular: Normal rate.   Respiratory: Effort normal.  GI: Soft. She exhibits no distension. There is no tenderness. There is no rebound and no guarding.  Genitourinary:  Mod blood (3-4 saturated swabs)in vault; small amount of tissue removed with ring forceps; cervical os appears slightly dilated   Musculoskeletal: Normal range of motion.  Neurological: She is alert and oriented to person, place, and time.  Skin: Skin is warm and dry.  Psychiatric: She has a normal mood and affect.    MAU Course  Procedures Results for orders placed or performed during the hospital encounter of 10/12/15 (from the past 24 hour(s))  Urinalysis, Routine w reflex microscopic (not at [facility])     Status: Abnormal   Collection Time: 10/12/15  5:20 PM  Result Value Ref Range   Color, Urine AMBER (A) YELLOW   APPearance CLOUDY (A) CLEAR   Specific Gravity, Urine 1.020 1.005 - 1.030   pH 6.0 5.0 - 8.0   Glucose, UA NEGATIVE NEGATIVE mg/dL   Hgb urine dipstick LARGE (A) NEGATIVE   Bilirubin Urine NEGATIVE NEGATIVE   Ketones, ur NEGATIVE NEGATIVE mg/dL   Protein, ur NEGATIVE NEGATIVE mg/dL   Nitrite NEGATIVE NEGATIVE   Leukocytes, UA NEGATIVE NEGATIVE  Urine microscopic-add on     Status: Abnormal   Collection Time: 10/12/15  5:20 PM  Result Value Ref Range   Squamous Epithelial / LPF 0-5 (A) NONE SEEN   WBC, UA 0-5 0 - 5 WBC/hpf   RBC / HPF TOO NUMEROUS TO COUNT 0 - 5 RBC/hpf   Bacteria, UA FEW (A) NONE SEEN  Pregnancy, urine POC     Status: Abnormal   Collection Time: 10/12/15  5:27 PM  Result Value Ref Range   Preg Test, Ur POSITIVE (A) NEGATIVE  Wet prep, genital  Status: Abnormal   Collection Time: 10/12/15  8:40 PM  Result Value Ref Range   Yeast Wet Prep HPF POC NONE SEEN NONE SEEN   Trich, Wet Prep NONE SEEN NONE SEEN   Clue Cells Wet Prep HPF POC NONE SEEN NONE SEEN   WBC, Wet Prep HPF POC MODERATE (A) NONE SEEN   Sperm NONE SEEN   No results found. Care transferred to [facility], CNM Assessment and Plan    Ebony [name],Ebony [name] 10/12/2015, 8:07 PM

## 2015-10-13 LAB — ABO/RH: ABO/RH(D): O POS

## 2015-10-13 LAB — GC/CHLAMYDIA PROBE AMP (~~LOC~~) NOT AT ARMC
CHLAMYDIA, DNA PROBE: NEGATIVE
Neisseria Gonorrhea: NEGATIVE

## 2015-10-13 LAB — HIV ANTIBODY (ROUTINE TESTING W REFLEX): HIV SCREEN 4TH GENERATION: NONREACTIVE

## 2015-10-14 ENCOUNTER — Encounter (HOSPITAL_COMMUNITY): Payer: Self-pay | Admitting: *Deleted

## 2015-10-14 ENCOUNTER — Inpatient Hospital Stay (HOSPITAL_COMMUNITY)
Admission: AD | Admit: 2015-10-14 | Discharge: 2015-10-14 | Disposition: A | Discharge status: Home / Self Care | Payer: Medicaid Other | Source: Ambulatory Visit | Attending: Obstetrics and Gynecology | Admitting: Obstetrics and Gynecology

## 2015-10-14 ENCOUNTER — Inpatient Hospital Stay (HOSPITAL_COMMUNITY): Payer: Medicaid Other

## 2015-10-14 DIAGNOSIS — O4691 Antepartum hemorrhage, unspecified, first trimester: Secondary | ICD-10-CM

## 2015-10-14 DIAGNOSIS — R102 Pelvic and perineal pain: Secondary | ICD-10-CM | POA: Diagnosis not present

## 2015-10-14 DIAGNOSIS — O3680X0 Pregnancy with inconclusive fetal viability, not applicable or unspecified: Secondary | ICD-10-CM

## 2015-10-14 DIAGNOSIS — O26891 Other specified pregnancy related conditions, first trimester: Secondary | ICD-10-CM | POA: Insufficient documentation

## 2015-10-14 DIAGNOSIS — O208 Other hemorrhage in early pregnancy: Secondary | ICD-10-CM | POA: Insufficient documentation

## 2015-10-14 DIAGNOSIS — Z3A01 Less than 8 weeks gestation of pregnancy: Secondary | ICD-10-CM | POA: Diagnosis not present

## 2015-10-14 LAB — CBC
HEMATOCRIT: 39.3 % (ref 36.0–46.0)
HEMOGLOBIN: 13.4 g/dL (ref 12.0–15.0)
MCH: 29.6 pg (ref 26.0–34.0)
MCHC: 34.1 g/dL (ref 30.0–36.0)
MCV: 86.9 fL (ref 78.0–100.0)
Platelets: 125 10*3/uL — ABNORMAL LOW (ref 150–400)
RBC: 4.52 MIL/uL (ref 3.87–5.11)
RDW: 13.7 % (ref 11.5–15.5)
WBC: 8.6 10*3/uL (ref 4.0–10.5)

## 2015-10-14 LAB — HCG, QUANTITATIVE, PREGNANCY: hCG, Beta Chain, Quant, S: 104 m[IU]/mL — ABNORMAL HIGH (ref <5)

## 2015-10-14 MED ORDER — IBUPROFEN 600 MG PO TABS
600.0000 mg | ORAL_TABLET | Freq: Once | ORAL | Status: DC
Start: 2015-10-14 — End: 2015-10-14

## 2015-10-14 NOTE — MAU Note (Signed)
Changing a pad every 20 minutes, abdominal pad, heavier vaginal bleeding.

## 2015-10-14 NOTE — MAU Provider Note (Signed)
History   [id number]   Chief Complaint  Patient presents with  . Vaginal Bleeding  . Abdominal Pain    HPI Ebony [name] is a 35 y.o. female [id number] here for follow-up BHCG.  Upon review of the records patient was first seen on 10/12/15 for vaginal bleeding and cramping.   BHCG on that day was 163.  Ultrasound showed no evidence of IUP.  Suspicious heterogeneous soft tissue noted inferior to right ovary with single 1.5 cm fodus, central hypoechoic structure.  GC/CT and wet prep were collected.  Results were GC/CT neg x 2; wet prep negative.   Pt discharged home with ectopic precautions.   Pt here today with report of increased left sided pelvic pain (opposite side) and increased vaginal bleeding with the passage of clots last night.   Also reports dizziness and lightheadedness.  All other systems negative.    Patient's last menstrual period was 08/29/2015 (approximate).  OB History  Gravida Para Term Preterm AB SAB TAB Ectopic Multiple Living  # Outcome Date GA Lbr Len/2nd Weight Sex Delivery Anes PTL Lv  4 Current           3 Term      CS-Unspec     2 Term      CS-Unspec     1 Term     F    FD      Past Medical History  Diagnosis Date  . Hypertension     gestational    History reviewed. No pertinent family history.  Social History   Social History  . Marital Status: Married    Spouse Name: N/A  . Number of Children: N/A  . Years of Education: N/A   Social History Main Topics  . Smoking status: Never Smoker   . Smokeless tobacco: Never Used  . Alcohol Use: No  . Drug Use: No  . Sexual Activity: Yes     Comment: last had intercourse on 10/10/15   Other Topics Concern  . None   Social History Narrative    No Known Allergies  No current facility-administered medications on file prior to encounter.   Current Outpatient Prescriptions on File Prior to Encounter  Medication Sig Dispense Refill  . acetaminophen (TYLENOL) 325 MG tablet Take  325-650 mg by mouth every 6 (six) hours as needed for moderate pain.      Review of Systems  Gastrointestinal: Negative for nausea and vomiting.  Genitourinary: Positive for vaginal bleeding and pelvic pain.  Neurological: Positive for dizziness and weakness. Negative for syncope.  All other systems reviewed and are negative.  Physical Exam   Filed Vitals:   10/14/15 1147 10/14/15 1151  BP: 131/86 118/81  Pulse: 91 92  Temp: 98.3 F (36.8 C)   Height:  (1.676 m)   Weight: 184 lb 9.6 oz (83.734 kg)     Physical Exam  Constitutional: She is oriented to person, place, and time. She appears well-developed and well-nourished.  Uncomfortable appearing  HENT:  Head: Normocephalic.  Neck: Normal range of motion. Neck supple.  Cardiovascular: Normal rate, regular rhythm and normal heart sounds.  Exam reveals no gallop and no friction rub.   No murmur heard. Respiratory: Effort normal and breath sounds normal. No respiratory distress.  GI: Soft. She exhibits no mass. There is tenderness (suprapubic). There is no rebound and no guarding.  Genitourinary: There is bleeding (moderate; +clots) in the vagina.  Neurological: She is alert and oriented to person, place, and time.  Skin: Skin is warm and dry.    MAU Course  Procedures Results for orders placed or performed during the hospital encounter of 10/14/15 (from the past 24 hour(s))  hCG, quantitative, pregnancy     Status: Abnormal   Collection Time: 10/14/15 11:54 AM  Result Value Ref Range   hCG, Beta Chain, Quant, S 104 (H) <5 mIU/mL  CBC     Status: Abnormal   Collection Time: 10/14/15 11:54 AM  Result Value Ref Range   WBC 8.6 4.0 - 10.5 K/uL   RBC 4.52 3.87 - 5.11 MIL/uL   Hemoglobin 13.4 12.0 - 15.0 g/dL   HCT 16.139.3 09.636.0 - 04.546.0 %   MCV 86.9 78.0 - 100.0 fL   MCH 29.6 26.0 - 34.0 pg   MCHC 34.1 30.0 - 36.0 g/dL   RDW 40.913.7 81.111.5 - 91.415.5 %   Platelets 125 (L) 150 - 400 K/uL   1235 Consulted with Ebony [name] >  Reviewed HPI/Exam > agreed with plan for repeat CBC and ultrasound  Ultrasound: Maternal uterus/adnexae: The left ovary is normal. A simple dominant follicular cyst versus corpus luteal cyst on the left measures up to 2.7 cm. The right ovary appears somewhat enlarged and heterogeneous. No definite ectopic pregnancy identified within either at adnexa. There is trace simple fluid layering within the cul-de-sac.  IMPRESSION: 1. No intrauterine gestational sac visualized. 2. No definite ectopic pregnancy identified. Recommend continued correlation with quantitative beta HCG in 48 hours. 3. Heterogeneous and slightly enlarged appearing right ovary. 4. Small volume free fluid in the pelvis appears simple and may be physiologic. No definite hemorrhage.   1400 Called and reviewed labs/ultrasound results with Ebony [name] > discharge home with patient follow-up in office for weekly BHCG, ectopic precautions  Assessment and Plan  35 y.o. [id number] at 5151w4d wks Pregnancy Follow-up BHCG Pregnancy of Unknown Location  Plan: Discharge home Ectopic precautions Call office on Monday to get scheduled for weekly BHCGs  Ebony [name], CNM 10/14/2015 3:00 PM    callan

## 2015-10-16 ENCOUNTER — Encounter (HOSPITAL_COMMUNITY): Payer: Self-pay | Admitting: *Deleted

## 2015-10-16 ENCOUNTER — Encounter (HOSPITAL_COMMUNITY): Payer: Self-pay

## 2016-03-04 IMAGING — US US FETAL BPP W/O NONSTRESS
1 series · 13 of 28 positions shown · non-contrast
Comparison: none

[Series 1: us fetal bpp w/o nonstress · non-contrast · 33 acquisitions, 13 frames shown]
[im 2/33]
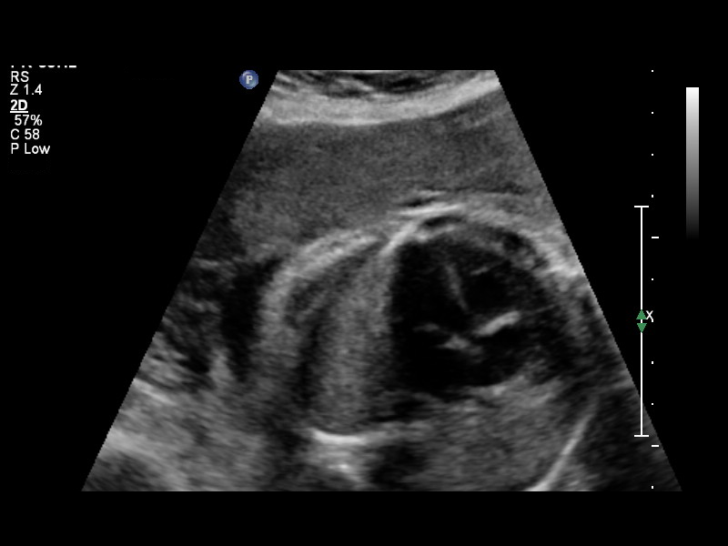
[im 4/33]
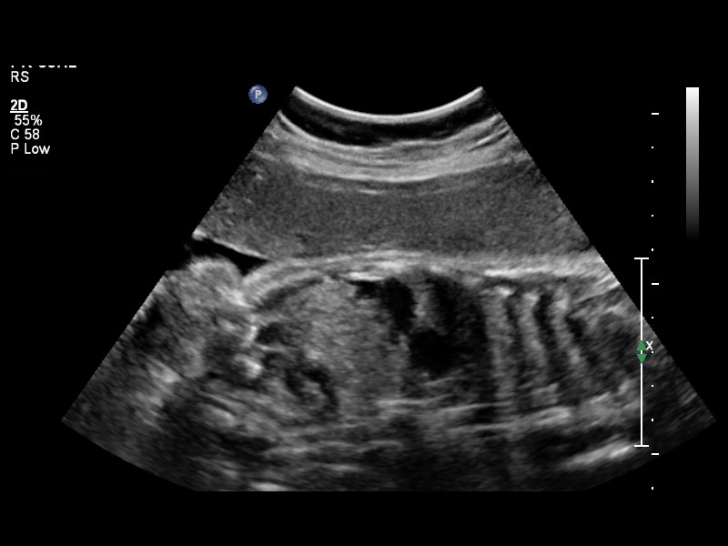
[im 6/33]
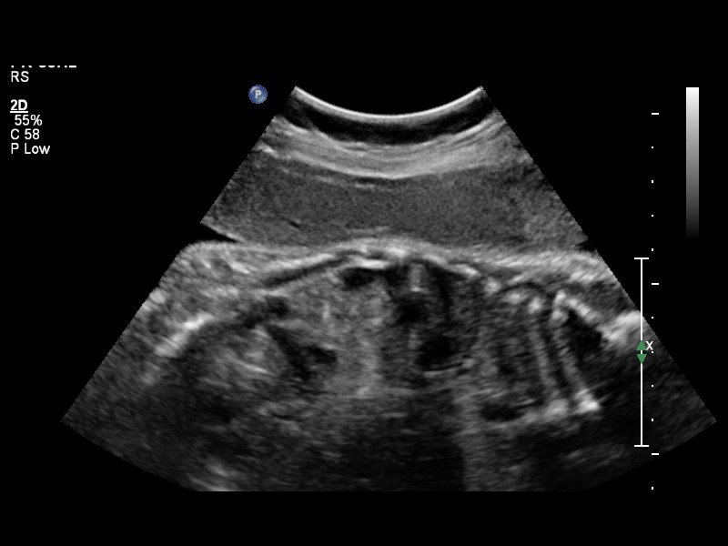
[im 9/33]
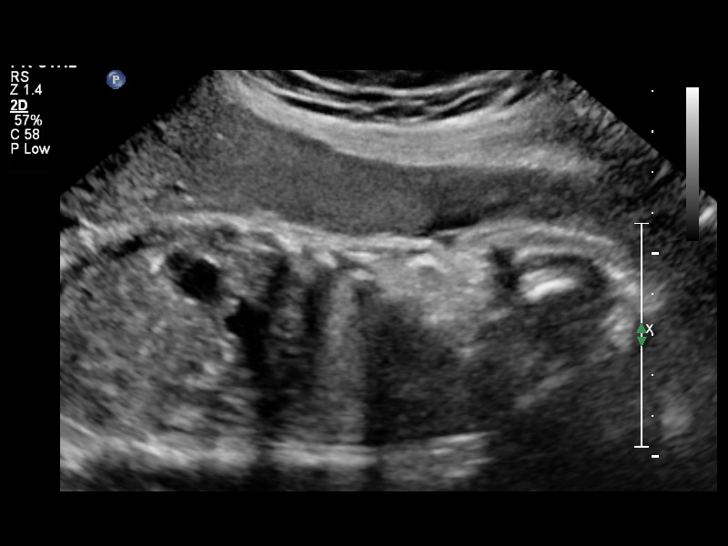
[im 11/33]
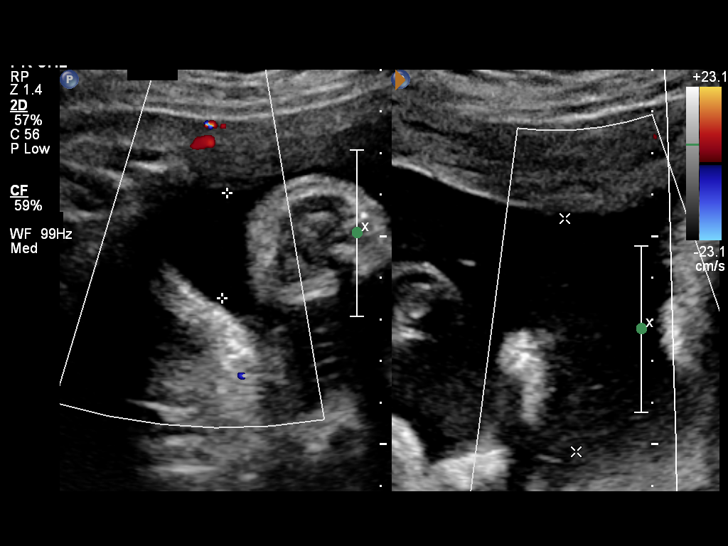
[im 14/33]
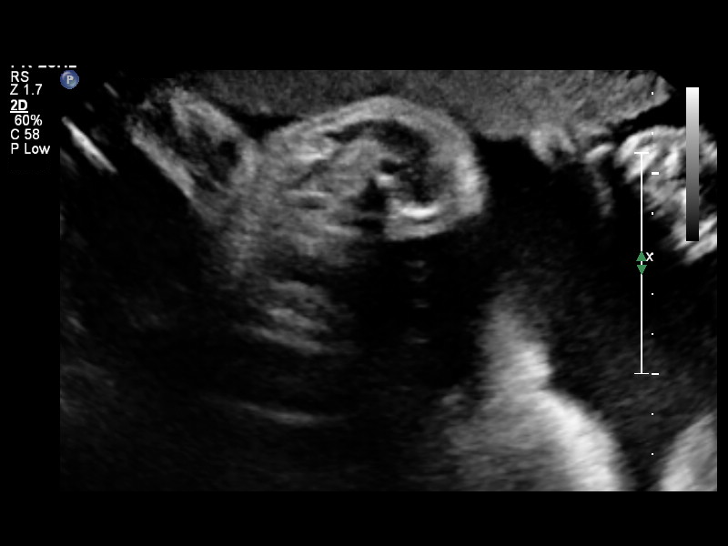
[im 17/33]
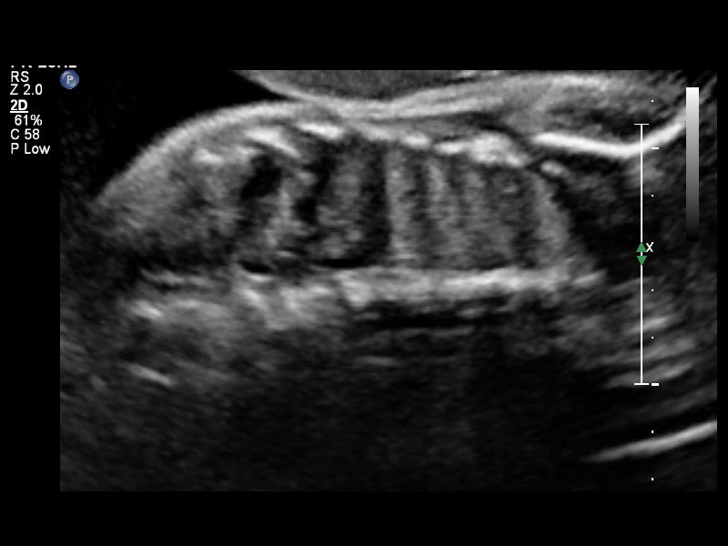
[im 19/33]
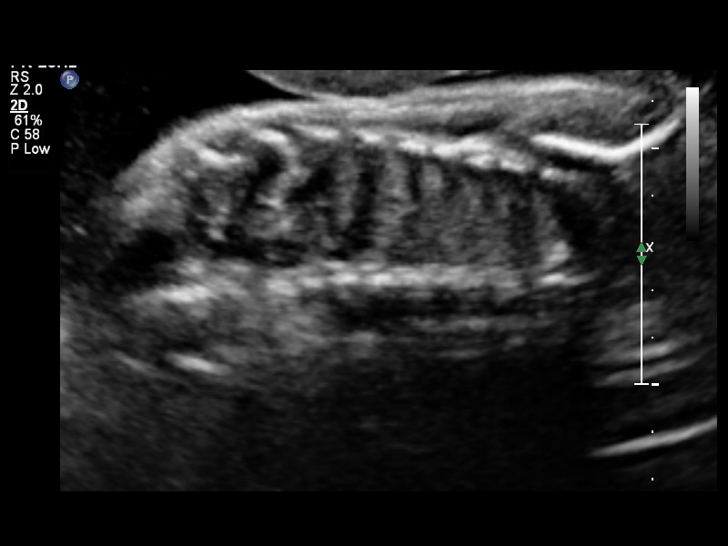
[im 22/33]
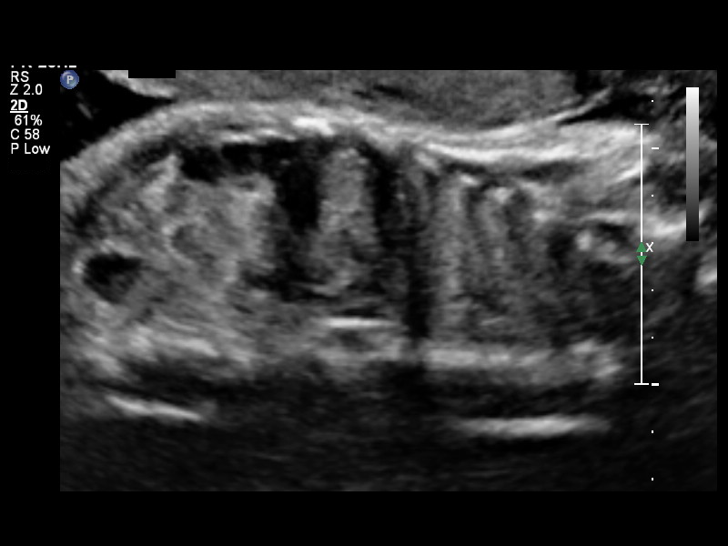
[im 24/33]
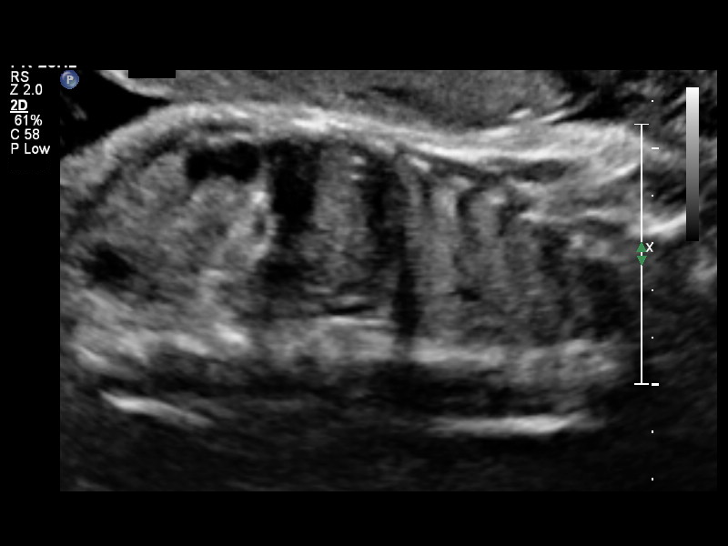
[im 27/33]
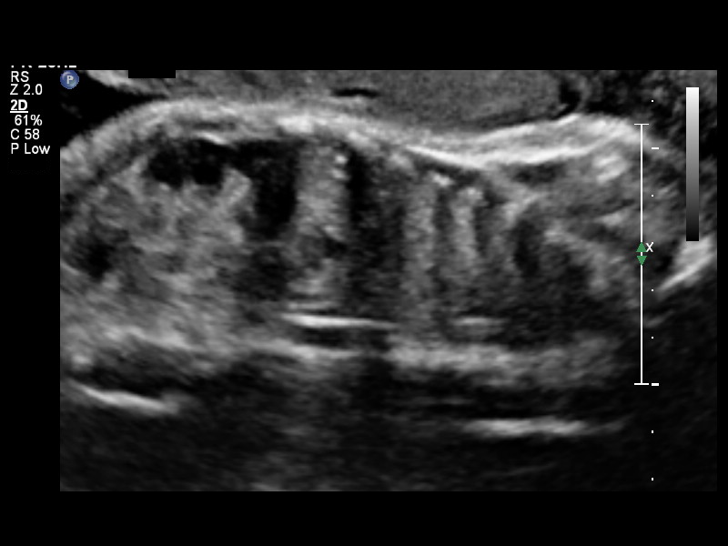
[im 29/33]
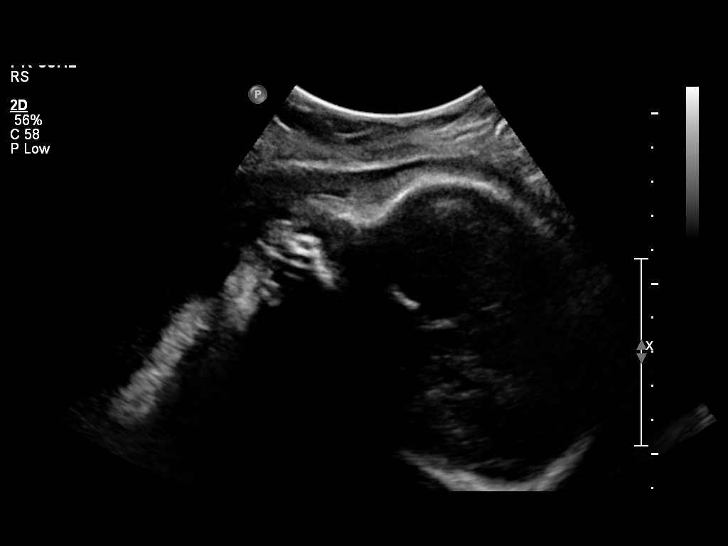
[im 31/33]
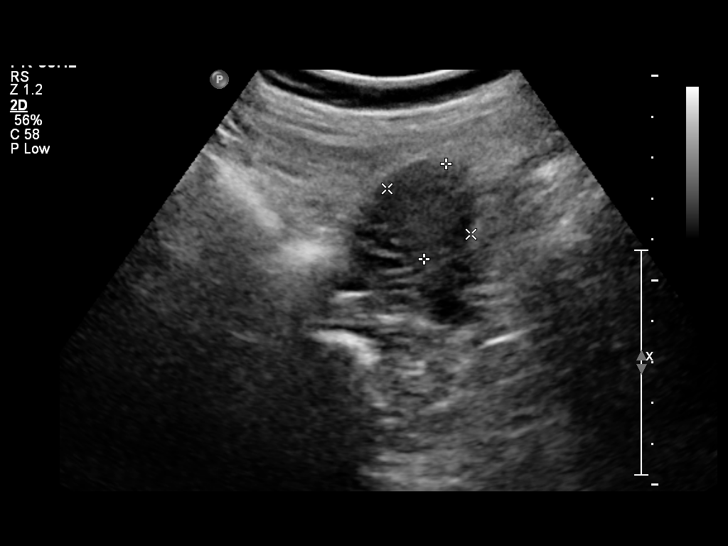

[13 of 28 positions shown; findings below may reference images not displayed]

OBSTETRICS REPORT

Service(s) Provided

Indications

 Non-reactive NST
 33 weeks gestation of pregnancy
Fetal Evaluation

 Num Of Fetuses:    1
 Fetal Heart Rate:  116                          bpm
 Cardiac Activity:  Observed
 Presentation:      Cephalic

 Amniotic Fluid
 AFI FV:      Subjectively within normal limits
 AFI Sum:     17.98   cm       66  %Tile     Larg Pckt:    5.58  cm
 RUQ:   5.27    cm   RLQ:    4.61   cm    LUQ:   2.52    cm   LLQ:    5.58   cm
Biophysical Evaluation

 Amniotic F.V:   Within normal limits       F. Tone:        Observed
 F. Movement:    Observed                   N.S.T:          Nonreactive
 F. Breathing:   Observed                   Score:          [DATE]
Gestational Age

 Clinical EDD:  33w 6d                                        EDD:   08/17/14
 Best:          33w 6d     Det. By:  Clinical EDD             EDD:   08/17/14
Cervix Uterus Adnexa

 Left Ovary:    Not visualized. No adnexal mass visualized.
 Right Ovary:   Size(cm) L: 2.38 x W: 2.48 x H: 2.32  Volume(cc):
Impression

 SIUP at 44w8d (remote interpretation of BPP only), hx IUFD
Recommendations

 1. continued antenatal testing (2x/wk NST and weekly AFI)
 2. interval growth monthly
 3. delivery at around 39 weeks gestation.
 questions or concerns.

## 2016-08-16 ENCOUNTER — Encounter (HOSPITAL_COMMUNITY): Payer: Self-pay

## 2017-09-09 ENCOUNTER — Encounter: Payer: Self-pay | Admitting: Urgent Care

## 2017-09-09 ENCOUNTER — Ambulatory Visit: Payer: Self-pay | Admitting: Urgent Care

## 2017-09-09 ENCOUNTER — Ambulatory Visit (INDEPENDENT_AMBULATORY_CARE_PROVIDER_SITE_OTHER): Payer: BLUE CROSS/BLUE SHIELD | Admitting: Urgent Care

## 2017-09-09 VITALS — BP 124/78 | HR 86 | Temp 98.7°F | Resp 18 | Ht 66.0 in | Wt 192.4 lb

## 2017-09-09 DIAGNOSIS — G8929 Other chronic pain: Secondary | ICD-10-CM | POA: Diagnosis not present

## 2017-09-09 DIAGNOSIS — M48061 Spinal stenosis, lumbar region without neurogenic claudication: Secondary | ICD-10-CM | POA: Diagnosis not present

## 2017-09-09 DIAGNOSIS — M5442 Lumbago with sciatica, left side: Secondary | ICD-10-CM | POA: Diagnosis not present

## 2017-09-09 DIAGNOSIS — F1990 Other psychoactive substance use, unspecified, uncomplicated: Secondary | ICD-10-CM | POA: Diagnosis not present

## 2017-09-09 DIAGNOSIS — Z791 Long term (current) use of non-steroidal anti-inflammatories (NSAID): Secondary | ICD-10-CM | POA: Diagnosis not present

## 2017-09-09 DIAGNOSIS — M5126 Other intervertebral disc displacement, lumbar region: Secondary | ICD-10-CM | POA: Diagnosis not present

## 2017-09-09 MED ORDER — METHYLPREDNISOLONE ACETATE 80 MG/ML IJ SUSP
80.0000 mg | Freq: Once | INTRAMUSCULAR | Status: AC
  Administered 2017-09-09: 80 mg via INTRAMUSCULAR

## 2017-09-09 MED ORDER — TRAMADOL-ACETAMINOPHEN 37.5-325 MG PO TABS: 1.0000 | ORAL_TABLET | Freq: Three times a day (TID) | ORAL | 0 refills | Status: DC | PRN

## 2017-09-09 NOTE — Progress Notes (Addendum)
   MRN: [id number] DOB: 02/15/1981  Subjective:   Ebony [name] is a 37 y.o. female presenting for 4 day history of mid-low back pain. Pain reports her pain is severe, radiates into her left leg. Denies fever, falls, trauma, incontinence, dysuria, hematuria. Patient works as a Location managermachine operator, does intermittent heavy lifting. Has tried ibuprofen and APAP with minimal relief. Has used heavy doses of ibuprofen, 400mg  every other hour. Patient's husband is concerned for her kidneys, would like to have this checked. MRI from 2016 of lumbar region shows herniated disc, disc extrusion, foraminal stenosis.   Ebony [name] has a current medication list which includes the following prescription(s): acetaminophen and ibuprofen. Also has No Known Allergies.  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 Also  has a past surgical history that includes Cesarean section (12/2008); Wisdom tooth extraction; Cesarean section (N/A, 08/24/2012); Cesarean section (N/A, 07/29/2014); and Cesarean section.  Objective:   Vitals: BP 124/78   Pulse 86   Temp 98.7 F (37.1 C) (Oral)   Resp 18   Ht 5\' 6"  (1.676 m)   Wt 192 lb 6.4 oz (87.3 kg)   SpO2 100%   BMI 31.05 kg/m   Physical Exam  Constitutional: She is oriented to person, place, and time. She appears well-developed and well-nourished.  Cardiovascular: Normal rate.  Pulmonary/Chest: Effort normal.  Musculoskeletal:       Lumbar back: She exhibits decreased range of motion and tenderness (positive SLR to the left). She exhibits no bony tenderness, no swelling, no edema, no deformity, no laceration and no spasm.  Neurological: She is alert and oriented to person, place, and time. She displays normal reflexes. Coordination (rises very slowly from seated position) abnormal.   Assessment and Plan :   Chronic  bilateral low back pain with left-sided sciatica - Plan: Basic metabolic panel, Ambulatory referral to Neurosurgery, methylPREDNISolone acetate (DEPO-MEDROL) injection 80 mg  Lumbar herniated disc - Plan: Basic metabolic panel, Ambulatory referral to Neurosurgery, methylPREDNISolone acetate (DEPO-MEDROL) injection 80 mg  Spinal stenosis of lumbar region, unspecified whether neurogenic claudication present - Plan: Ambulatory referral to Neurosurgery, methylPREDNISolone acetate (DEPO-MEDROL) injection 80 mg  NSAID long-term use  Excessive use of nonsteroidal anti-inflammatory drug (NSAID) - Plan: Basic metabolic panel  Patient's husband had very disruptive behavior. He frequently interrupted the patient and myself during the office visit. He was very displeased about work restrictions that I wrote for patient. I counseled that I placed patient on light duty because she is not cognitively impaired, can use her arms but wrote specific restrictions for not lifting. I discussed treatment plan including the fact that the patient needs to be evaluated by a neurologist for her foraminal stenosis, disc herniation, disc extrusion. I will defer to them regarding repeat imaging, treatment plan and work restrictions.   Wallis BambergMario Aliah Eriksson, PA-C [facility] at [facility] [phone] 09/09/2017  3:36 PM

## 2017-09-09 NOTE — Patient Instructions (Addendum)
You may take 500mg  Tylenol every 6 hours for pain and inflammation. You may use Ultracet if scheduling Tylenol does not control your pain. Ultracet can be used up to 3 times daily as needed. Do not take any other NSAID including diclofenac, ibuprofen, naproxen, Aleve, Motrin.      Herniated Disk A herniated disk, also called a ruptured disk or slipped disk, occurs when a disk in the spine bulges out too far. Between the bones in the spine (vertebrae), there are oval disks that are made of a soft, spongy center that is surrounded by a tough outer ring. The disks connect your vertebrae, help your spine move, and absorb shocks from your movement. When you have a herniated disk, the spongy center of the disk bulges out or breaks through the outer ring. It can press on a nerve between the vertebrae and cause pain. This can occur anywhere in the back or neck area, but the lower back is most commonly affected. What are the causes? This condition may be caused by:  Age-related wear and tear. The spongy centers of spinal disks tend to shrink and dry out with age, which makes them more likely to herniate.  Sudden injury, such as a strain or sprain.  What increases the risk? Aging is the main risk factor for a herniated disk. Other risk factors include:  Being a man who is 5230-39 years old.  Frequently doing activities that involve heavy lifting, bending, or twisting.  Frequently driving for long hours at a time.  Not getting enough exercise.  Being overweight.  Smoking.  Having a family history of back problems or herniated disks.  Being pregnant or giving birth.  Having poor nutrition.  Being tall.  What are the signs or symptoms? Symptoms may vary depending on where your herniated disk is located.  A herniated disk in the lower back may cause sharp pain in: ? Part of the arm, leg, hip, or buttocks. ? The back of the lower leg (calf). ? The lower back, spreading down through the leg  into the foot (sciatica).  A herniated disk in the neck may cause dizziness and vertigo. It may also cause pain or weakness in: ? The neck. ? The shoulder blades. ? Upper arm, forearm, or fingers.  You may also have muscle weakness. It may be difficult to: ? Lift your leg or arm. ? Stand on your toes. ? Squeeze tightly with one of your hands.  Other symptoms may include: ? Numbness or tingling in the affected areas of the hands, arms, feet, or legs. ? Inability to control when you urinate or when you have bowel movements. This is a rare but serious sign of a severe herniated disk in the lower back.  How is this diagnosed? This condition may be diagnosed based on:  Your symptoms.  Your medical history.  A physical exam. The exam may include: ? Straight-leg test. You will lie on your back while your health care provider lifts your leg, keeping your knee straight. If you feel pain, you likely have a herniated disk. ? Neurological tests. This includes checking for numbness, reflexes, muscle strength, and posture.  Imaging tests, such as: ? X-rays. ? MRI. ? CT scan. ? Electromyogram (EMG) to check the nerves that control muscles. This test may be used to determine which nerves are affected by your herniated disk.  How is this treated? Treatment for this condition may include:  A short period of rest. This is usually the first  treatment. ? You may be on bed rest for up to 2 days, or you may be instructed to stay home and avoid physical activity. ? If you have a herniated disk in your lower back, avoid sitting as much as possible. Sitting increases pressure on the disk.  Medicines. These may include: ? NSAIDs to help reduce pain and swelling. ? Muscle relaxants to prevent sudden tightening of the back muscles (back spasms). ? Prescription pain medicines, if you have severe pain.  Steroid injections in the area of the herniated disk. This can help reduce pain and  swelling.  Physical therapy to strengthen your back muscles.  In many cases, symptoms go away with treatment over a period of days or weeks. You will most likely be free of symptoms after 3-4 months. If other treatments do not help to relieve your symptoms, you may need surgery. Follow these instructions at home: Medicines  Take over-the-counter and prescription medicines only as told by your health care provider.  Do not drive or use heavy machinery while taking prescription pain medicine. Activity  Rest as directed.  After your rest period: ? Return to your normal activities and gradually begin exercising as told by your health care provider. Ask your health care provider what activities and exercises are safe for you. ? Use good posture. ? Avoid movements that cause pain. ? Do not lift anything that is heavier than 10 lb (4.5 kg) until your health care provider says this is safe. ? Do not sit or stand for long periods of time without changing positions. ? Do not sit for long periods of time without getting up and moving around.  If physical therapy was prescribed, do exercises as instructed.  Aim to strengthen muscles in your back and abdomen with exercises like crunches, swimming, or walking. General instructions  Do not use any products that contain nicotine or tobacco, such as cigarettes and e-cigarettes. These products can delay healing. If you need help quitting, ask your health care provider.  Do not wear high-heeled shoes.  Do not sleep on your belly.  If you are overweight, work with your health care provider to lose weight safely.  To prevent or treat constipation while you are taking prescription pain medicine, your health care provider may recommend that you: ? Drink enough fluid to keep your urine clear or pale yellow. ? Take over-the-counter or prescription medicines. ? Eat foods that are high in fiber, such as fresh fruits and vegetables, whole grains, and  beans. ? Limit foods that are high in fat and processed sugars, such as fried and sweet foods.  Keep all follow-up visits as told by your health care provider. This is important. How is this prevented?  Maintain a healthy weight.  Try to avoid stressful situations.  Maintain physical fitness. Do at least 150 minutes of moderate-intensity exercise each week, such as brisk walking or water aerobics.  When lifting objects: ? Keep your feet at least shoulder-width apart and tighten your abdominal muscles. ? Keep your spine neutral as you bend your knees and hips. It is important to lift using the strength of your legs, not your back. Do not lock your knees straight out. ? Always ask for help to lift heavy or awkward objects. Contact a health care provider if:  You have back pain or neck pain that does not get better after 6 weeks.  You have severe pain in your back, neck, legs, or arms.  You develop numbness, tingling, or weakness  in any part of your body. Get help right away if:  You cannot move your arms or legs.  You cannot control when you urinate or have bowel movements.  You feel dizzy or you faint.  You have shortness of breath. This information is not intended to replace advice given to you by your health care provider. Make sure you discuss any questions you have with your health care provider. Document Released: 06/14/2000 Document Revised: 02/12/2016 Document Reviewed: 02/12/2016 Elsevier Interactive Patient Education  2017 [employer].     IF you received an x-ray today, you will receive an invoice from [facility]. Please contact [facility] at [phone] with questions or concerns regarding your invoice.   IF you received labwork today, you will receive an invoice from [facility]. Please contact [facility] at [phone] with questions or concerns regarding your invoice.   Our billing staff will not be able to assist you with questions regarding  bills from these companies.  You will be contacted with the lab results as soon as they are available. The fastest way to get your results is to activate your My Chart account. Instructions are located on the last page of this paperwork. If you have not heard from [place] regarding the results in 2 weeks, please contact this office.

## 2017-09-10 LAB — BASIC METABOLIC PANEL
BUN / CREAT RATIO: 24 — AB (ref 9–23)
BUN: 15 mg/dL (ref 6–20)
CHLORIDE: 104 mmol/L (ref 96–106)
CO2: 21 mmol/L (ref 20–29)
CREATININE: 0.62 mg/dL (ref 0.57–1.00)
Calcium: 9.2 mg/dL (ref 8.7–10.2)
GFR calc Af Amer: 132 mL/min/{1.73_m2} (ref >59)
GFR calc non Af Amer: 115 mL/min/{1.73_m2} (ref >59)
GLUCOSE: 96 mg/dL (ref 65–99)
POTASSIUM: 3.9 mmol/L (ref 3.5–5.2)
SODIUM: 139 mmol/L (ref 134–144)

## 2017-09-11 ENCOUNTER — Encounter: Payer: Self-pay | Admitting: Urgent Care

## 2017-09-11 NOTE — Progress Notes (Signed)
Notified by letter. 

## 2017-09-12 DIAGNOSIS — M545 Low back pain: Secondary | ICD-10-CM | POA: Diagnosis not present

## 2017-09-12 DIAGNOSIS — M5432 Sciatica, left side: Secondary | ICD-10-CM | POA: Diagnosis not present

## 2017-10-08 DIAGNOSIS — Z6830 Body mass index (BMI) 30.0-30.9, adult: Secondary | ICD-10-CM | POA: Diagnosis not present

## 2017-10-08 DIAGNOSIS — M5416 Radiculopathy, lumbar region: Secondary | ICD-10-CM | POA: Diagnosis not present

## 2017-10-21 DIAGNOSIS — M5416 Radiculopathy, lumbar region: Secondary | ICD-10-CM | POA: Diagnosis not present

## 2017-10-21 DIAGNOSIS — M48061 Spinal stenosis, lumbar region without neurogenic claudication: Secondary | ICD-10-CM | POA: Diagnosis not present

## 2017-10-21 DIAGNOSIS — M5116 Intervertebral disc disorders with radiculopathy, lumbar region: Secondary | ICD-10-CM | POA: Diagnosis not present

## 2017-10-31 DIAGNOSIS — Z6830 Body mass index (BMI) 30.0-30.9, adult: Secondary | ICD-10-CM | POA: Diagnosis not present

## 2017-10-31 DIAGNOSIS — M5416 Radiculopathy, lumbar region: Secondary | ICD-10-CM | POA: Diagnosis not present

## 2017-12-25 DIAGNOSIS — M5416 Radiculopathy, lumbar region: Secondary | ICD-10-CM | POA: Diagnosis not present

## 2018-05-26 ENCOUNTER — Ambulatory Visit (INDEPENDENT_AMBULATORY_CARE_PROVIDER_SITE_OTHER): Payer: BLUE CROSS/BLUE SHIELD | Admitting: Family Medicine

## 2018-05-26 ENCOUNTER — Other Ambulatory Visit: Payer: Self-pay

## 2018-05-26 ENCOUNTER — Encounter: Payer: Self-pay | Admitting: Family Medicine

## 2018-05-26 VITALS — BP 115/75 | HR 81 | Temp 98.2°F | Ht 66.0 in | Wt 184.2 lb

## 2018-05-26 DIAGNOSIS — Z13220 Encounter for screening for lipoid disorders: Secondary | ICD-10-CM | POA: Diagnosis not present

## 2018-05-26 DIAGNOSIS — Z130 Encounter for screening for diseases of the blood and blood-forming organs and certain disorders involving the immune mechanism: Secondary | ICD-10-CM

## 2018-05-26 DIAGNOSIS — Z13228 Encounter for screening for other metabolic disorders: Secondary | ICD-10-CM | POA: Diagnosis not present

## 2018-05-26 DIAGNOSIS — Z01411 Encounter for gynecological examination (general) (routine) with abnormal findings: Secondary | ICD-10-CM | POA: Diagnosis not present

## 2018-05-26 DIAGNOSIS — Z1329 Encounter for screening for other suspected endocrine disorder: Secondary | ICD-10-CM | POA: Diagnosis not present

## 2018-05-26 DIAGNOSIS — R002 Palpitations: Secondary | ICD-10-CM

## 2018-05-26 DIAGNOSIS — Z23 Encounter for immunization: Secondary | ICD-10-CM | POA: Diagnosis not present

## 2018-05-26 DIAGNOSIS — Z01419 Encounter for gynecological examination (general) (routine) without abnormal findings: Secondary | ICD-10-CM

## 2018-05-26 NOTE — Patient Instructions (Addendum)
If you have lab work done today you will be contacted with your lab results within the next 2 weeks.  If you have not heard from us then please contact us. The fastest way to get your results is to register for My Chart.   IF you received an x-ray today, you will receive an invoice from [facility]. Please contact [facility] at [phone] with questions or concerns regarding your invoice.   IF you received labwork today, you will receive an invoice from [facility]. Please contact [facility] at [phone] with questions or concerns regarding your invoice.   Our billing staff will not be able to assist you with questions regarding bills from these companies.  You will be contacted with the lab results as soon as they are available. The fastest way to get your results is to activate your My Chart account. Instructions are located on the last page of this paperwork. If you have not heard from us regarding the results in 2 weeks, please contact this office.        If you have lab work done today you will be contacted with your lab results within the next 2 weeks.  If you have not heard from us then please contact us. The fastest way to get your results is to register for My Chart.   IF you received an x-ray today, you will receive an invoice from [facility]. Please contact [facility] at [phone] with questions or concerns regarding your invoice.   IF you received labwork today, you will receive an invoice from [facility]. Please contact [facility] at [phone] with questions or concerns regarding your invoice.   Our billing staff will not be able to assist you with questions regarding bills from these companies.  You will be contacted with the lab results as soon as they are available. The fastest way to get your results is to activate your My Chart account. Instructions are located on the last page of this paperwork. If you have not heard from us  regarding the results in 2 weeks, please contact this office.     Contraception Choices Contraception, also called birth control, refers to methods or devices that prevent pregnancy. Hormonal methods Contraceptive implant A contraceptive implant is a thin, plastic tube that contains a hormone. It is inserted into the upper part of the arm. It can remain in place for up to 3 years. Progestin-only injections Progestin-only injections are injections of progestin, a synthetic form of the hormone progesterone. They are given every 3 months by a health care provider. Birth control pills Birth control pills are pills that contain hormones that prevent pregnancy. They must be taken once a day, preferably at the same time each day. Birth control patch The birth control patch contains hormones that prevent pregnancy. It is placed on the skin and must be changed once a week for three weeks and removed on the fourth week. A prescription is needed to use this method of contraception. Vaginal ring A vaginal ring contains hormones that prevent pregnancy. It is placed in the vagina for three weeks and removed on the fourth week. After that, the process is repeated with a new ring. A prescription is needed to use this method of contraception. Emergency contraceptive Emergency contraceptives prevent pregnancy after unprotected sex. They come in pill form and can be taken up to 5 days after sex. They work best the sooner they are taken after having sex. Most emergency contraceptives are available without  a prescription. This method should not be used as your only form of birth control. Barrier methods Female condom A female condom is a thin sheath that is worn over the penis during sex. Condoms keep sperm from going inside a woman's body. They can be used with a spermicide to increase their effectiveness. They should be disposed after a single use. Female condom A female condom is a soft, loose-fitting sheath that  is put into the vagina before sex. The condom keeps sperm from going inside a woman's body. They should be disposed after a single use. Diaphragm A diaphragm is a soft, dome-shaped barrier. It is inserted into the vagina before sex, along with a spermicide. The diaphragm blocks sperm from entering the uterus, and the spermicide kills sperm. A diaphragm should be left in the vagina for 6-8 hours after sex and removed within 24 hours. A diaphragm is prescribed and fitted by a health care provider. A diaphragm should be replaced every 1-2 years, after giving birth, after gaining more than 15 lb (6.8 kg), and after pelvic surgery. Cervical cap A cervical cap is a round, soft latex or plastic cup that fits over the cervix. It is inserted into the vagina before sex, along with spermicide. It blocks sperm from entering the uterus. The cap should be left in place for 6-8 hours after sex and removed within 48 hours. A cervical cap must be prescribed and fitted by a health care provider. It should be replaced every 2 years. Sponge A sponge is a soft, circular piece of polyurethane foam with spermicide on it. The sponge helps block sperm from entering the uterus, and the spermicide kills sperm. To use it, you make it wet and then insert it into the vagina. It should be inserted before sex, left in for at least 6 hours after sex, and removed and thrown away within 30 hours. Spermicides Spermicides are chemicals that kill or block sperm from entering the cervix and uterus. They can come as a cream, jelly, suppository, foam, or tablet. A spermicide should be inserted into the vagina with an applicator at least 10-15 minutes before sex to allow time for it to work. The process must be repeated every time you have sex. Spermicides do not require a prescription. Intrauterine contraception Intrauterine device (IUD) An IUD is a T-shaped device that is put in a woman's uterus. There are two types:  Hormone IUD.This type  contains progestin, a synthetic form of the hormone progesterone. This type can stay in place for 3-5 years.  Copper IUD.This type is wrapped in copper wire. It can stay in place for 10 years.  Permanent methods of contraception Female tubal ligation In this method, a woman's fallopian tubes are sealed, tied, or blocked during surgery to prevent eggs from traveling to the uterus. Hysteroscopic sterilization In this method, a small, flexible insert is placed into each fallopian tube. The inserts cause scar tissue to form in the fallopian tubes and block them, so sperm cannot reach an egg. The procedure takes about 3 months to be effective. Another form of birth control must be used during those 3 months. Female sterilization This is a procedure to tie off the tubes that carry sperm (vasectomy). After the procedure, the man can still ejaculate fluid (semen). Natural planning methods Natural family planning In this method, a couple does not have sex on days when the woman could become pregnant. Calendar method This means keeping track of the length of each menstrual cycle, identifying the  days when pregnancy can happen, and not having sex on those days. Ovulation method In this method, a couple avoids sex during ovulation. Symptothermal method This method involves not having sex during ovulation. The woman typically checks for ovulation by watching changes in her temperature and in the consistency of cervical mucus. Post-ovulation method In this method, a couple waits to have sex until after ovulation. Summary  Contraception, also called birth control, means methods or devices that prevent pregnancy.  Hormonal methods of contraception include implants, injections, pills, patches, vaginal rings, and emergency contraceptives.  Barrier methods of contraception can include female condoms, female condoms, diaphragms, cervical caps, sponges, and spermicides.  There are two types of IUDs  (intrauterine devices). An IUD can be put in a woman's uterus to prevent pregnancy for 3-5 years.  Permanent sterilization can be done through a procedure for males, females, or both.  Natural family planning methods involve not having sex on days when the woman could become pregnant. This information is not intended to replace advice given to you by your health care provider. Make sure you discuss any questions you have with your health care provider. Document Released: 06/17/2005 Document Revised: 07/20/2016 Document Reviewed: 07/20/2016 Elsevier Interactive Patient Education  2018 [employer].  Intrauterine Device Information An intrauterine device (IUD) is inserted into your uterus to prevent pregnancy. There are two types of IUDs available:  Copper IUD-This type of IUD is wrapped in copper wire and is placed inside the uterus. Copper makes the uterus and fallopian tubes produce a fluid that kills sperm. The copper IUD can stay in place for 10 years.  Hormone IUD-This type of IUD contains the hormone progestin (synthetic progesterone). The hormone thickens the cervical mucus and prevents sperm from entering the uterus. It also thins the uterine lining to prevent implantation of a fertilized egg. The hormone can weaken or kill the sperm that get into the uterus. One type of hormone IUD can stay in place for 5 years, and another type can stay in place for 3 years.  Your health care provider will make sure you are a good candidate for a contraceptive IUD. Discuss with your health care provider the possible side effects. Advantages of an intrauterine device  IUDs are highly effective, reversible, long acting, and low maintenance.  There are no estrogen-related side effects.  An IUD can be used when breastfeeding.  IUDs are not associated with weight gain.  The copper IUD works immediately after insertion.  The hormone IUD works right away if inserted within 7 days of your period  starting. You will need to use a backup method of birth control for 7 days if the hormone IUD is inserted at any other time in your cycle.  The copper IUD does not interfere with your female hormones.  The hormone IUD can make heavy menstrual periods lighter and decrease cramping.  The hormone IUD can be used for 3 or 5 years.  The copper IUD can be used for 10 years. Disadvantages of an intrauterine device  The hormone IUD can be associated with irregular bleeding patterns.  The copper IUD can make your menstrual flow heavier and more painful.  You may experience cramping and vaginal bleeding after insertion. This information is not intended to replace advice given to you by your health care provider. Make sure you discuss any questions you have with your health care provider. Document Released: 05/21/2004 Document Revised: 11/23/2015 Document Reviewed: 12/06/2012 Elsevier Interactive Patient Education  2017 [employer].

## 2018-05-26 NOTE — Progress Notes (Signed)
11/26/20192:23 PM  Velita Belk May 14, 1981, 37 y.o. female [id number]  Chief Complaint  Patient presents with  . Annual Exam    HPI:   Patient is a 37 y.o. female who presents today for CPE  Cervical Cancer Screening: 2015, normal Breast Cancer Screening: at age 59 Colorectal Cancer Screening: at age 63 HIV Screening: 2017 STI Screening: 2017 Seasonal Influenza Vaccination: given today Td/Tdap Vaccination: 2016 Pneumococcal Vaccination: at age 42 Frequency of Dental evaluation: needs to see dentist Frequency of Eye evaluation: does not wear eyeglasses  G4P3, not on BC, does not desire any more children   Has episodes of plapitations 2-6 minutes Associated with SOB and dizziness Happens maybe once every month or every other month Last episode was 4 days ago, previous one was about 3 months while she was back home in [place] resolves Has not seek medical  Does not smoke No fhx CAD Drinks 5-6 cups of coffee a day  Fall Risk  05/26/2018  Falls in the past year? 0     Depression screen [facility] 2/9 05/26/2018 09/09/2017  Decreased Interest 0 0  Down, Depressed, Hopeless 0 0  PHQ - 2 Score 0 0    No Known Allergies  Prior to Admission medications   Medication Sig Start Date End Date Taking? Authorizing Provider  acetaminophen (TYLENOL) 325 MG tablet Take 325-650 mg by mouth every 6 (six) hours as needed for moderate pain.   Yes [provider]    Past Medical History:  Diagnosis Date  . Anemia    history  . Headache(784.0)    otc med prn  . Hypertension    gestational  . 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 PONV (postoperative nausea and vomiting)     Past Surgical History:  Procedure Laterality Date  . CESAREAN SECTION  12/2008  . CESAREAN SECTION N/A 08/24/2012   Procedure: CESAREAN SECTION;  Surgeon: Sharene Butters, MD;  Location: [facility];  Service: Obstetrics;  Laterality: N/A;  . CESAREAN  SECTION N/A 07/29/2014   Procedure: CESAREAN SECTION;  Surgeon: Sanjuana Kava, MD;  Location: [facility];  Service: Obstetrics;  Laterality: N/A;  . CESAREAN SECTION    . WISDOM TOOTH EXTRACTION      Social History   Tobacco Use  . Smoking status: Never Smoker  . Smokeless tobacco: Never Used  Substance Use Topics  . Alcohol use: No    History reviewed. No pertinent family history.  Review of Systems  Constitutional: Negative for chills and fever.  Respiratory: Positive for shortness of breath. Negative for cough.   Cardiovascular: Positive for palpitations. Negative for chest pain and leg swelling.  Gastrointestinal: Negative for abdominal pain, nausea and vomiting.  Neurological: Positive for dizziness.     OBJECTIVE:  Blood pressure 115/75, pulse 81, temperature 98.2 F (36.8 C), temperature source Oral, height _0  (1.676 m), weight 184 lb 3.2 oz (83.6 kg), last menstrual period 04/26/2018, SpO2 100 %, unknown if currently breastfeeding. Body mass index is 29.73 kg/m.    Hearing Screening   _1  _2  _3  _4  _5  _6  _7  _8  _9   Right ear: 15          Left ear:             Visual Acuity Screening   Right eye Left eye Both eyes  Without correction: _10  With correction:       Physical Exam  Constitutional: She is oriented to person, place,  and time. She appears well-developed and well-nourished.  HENT:  Head: Normocephalic and atraumatic.  Right Ear: Hearing, tympanic membrane, external ear and ear canal normal.  Left Ear: Hearing, tympanic membrane, external ear and ear canal normal.  Mouth/Throat: Oropharynx is clear and moist.  Eyes: Pupils are equal, round, and reactive to light. Conjunctivae and EOM are normal.  Neck: Neck supple. No thyromegaly present.  Cardiovascular: Normal rate, regular rhythm, normal heart sounds and intact distal pulses. Exam reveals no gallop and no friction rub.  No murmur heard. Pulmonary/Chest:  Effort normal and breath sounds normal. She has no wheezes. She has no rales. Right breast exhibits no inverted nipple, no mass, no nipple discharge, no skin change and no tenderness. Left breast exhibits no inverted nipple, no mass, no nipple discharge, no skin change and no tenderness. Breasts are symmetrical.  Abdominal: Soft. Bowel sounds are normal. She exhibits no distension and no mass. There is no tenderness.  Musculoskeletal: Normal range of motion. She exhibits no edema.  Lymphadenopathy:    She has no cervical adenopathy.    She has no axillary adenopathy.       Right: No supraclavicular adenopathy present.       Left: No supraclavicular adenopathy present.  Neurological: She is alert and oriented to person, place, and time. She has normal reflexes.  Skin: Skin is warm and dry.  Psychiatric: She has a normal mood and affect.  Nursing note and vitals reviewed.  My interpretation of EKG:  NSR, HR 76, no st changes   ASSESSMENT and PLAN  1. Women's annual routine gynecological examination  Routine HCM labs ordered. HCM reviewed/discussed. Anticipatory guidance regarding healthy weight, lifestyle and choices given. Discussed BC options, she is thinking maybe an IUD, would like to think more about it and discuss at next appointment  - Pap IG w/ reflex to HPV when ASC-U  2. Need for prophylactic vaccination and inoculation against influenza - Flu Vaccine QUAD 36+ mos IM  3. Screening for deficiency anemia - CBC with Differential/Platelet  4. Screening for thyroid disorder - TSH  5. Screening for endocrine, metabolic and immunity disorder - CMP14+EGFR  6. Screening for lipid disorders - Lipid panel  7. Palpitations - Ambulatory referral to Cardiology - EKG 12-Lead   Return in about 6 weeks (around 07/07/2018) for birth control conversation.    Rutherford Guys, MD [facility], [place] [address] Ph.  [phone] Fax [phone]

## 2018-05-27 LAB — CMP14+EGFR
ALT: 26 IU/L (ref 0–32)
AST: 17 IU/L (ref 0–40)
Albumin/Globulin Ratio: 1.5 (ref 1.2–2.2)
Albumin: 4.6 g/dL (ref 3.5–5.5)
Alkaline Phosphatase: 64 IU/L (ref 39–117)
BUN/Creatinine Ratio: 25 — ABNORMAL HIGH (ref 9–23)
BUN: 15 mg/dL (ref 6–20)
Bilirubin Total: 0.4 mg/dL (ref 0.0–1.2)
CO2: 23 mmol/L (ref 20–29)
Calcium: 9.6 mg/dL (ref 8.7–10.2)
Chloride: 101 mmol/L (ref 96–106)
Creatinine, Ser: 0.59 mg/dL (ref 0.57–1.00)
GFR calc Af Amer: 134 mL/min/{1.73_m2} (ref >59)
GFR calc non Af Amer: 116 mL/min/{1.73_m2} (ref >59)
Globulin, Total: 3.1 g/dL (ref 1.5–4.5)
Glucose: 96 mg/dL (ref 65–99)
Potassium: 4.5 mmol/L (ref 3.5–5.2)
Sodium: 140 mmol/L (ref 134–144)
Total Protein: 7.7 g/dL (ref 6.0–8.5)

## 2018-05-27 LAB — CBC WITH DIFFERENTIAL/PLATELET
Basophils Absolute: 0.1 10*3/uL (ref 0.0–0.2)
Basos: 1 %
EOS (ABSOLUTE): 0 10*3/uL (ref 0.0–0.4)
Eos: 0 %
Hematocrit: 41.1 % (ref 34.0–46.6)
Hemoglobin: 14 g/dL (ref 11.1–15.9)
Immature Grans (Abs): 0 10*3/uL (ref 0.0–0.1)
Immature Granulocytes: 0 %
Lymphocytes Absolute: 2.5 10*3/uL (ref 0.7–3.1)
Lymphs: 28 %
MCH: 29.4 pg (ref 26.6–33.0)
MCHC: 34.1 g/dL (ref 31.5–35.7)
MCV: 86 fL (ref 79–97)
Monocytes Absolute: 0.4 10*3/uL (ref 0.1–0.9)
Monocytes: 5 %
Neutrophils Absolute: 6 10*3/uL (ref 1.4–7.0)
Neutrophils: 66 %
Platelets: 137 10*3/uL — ABNORMAL LOW (ref 150–450)
RBC: 4.77 x10E6/uL (ref 3.77–5.28)
RDW: 12.4 % (ref 12.3–15.4)
WBC: 9 10*3/uL (ref 3.4–10.8)

## 2018-05-27 LAB — LIPID PANEL
Chol/HDL Ratio: 2.8 ratio (ref 0.0–4.4)
Cholesterol, Total: 157 mg/dL (ref 100–199)
HDL: 56 mg/dL (ref >39)
LDL Calculated: 87 mg/dL (ref 0–99)
Triglycerides: 69 mg/dL (ref 0–149)
VLDL Cholesterol Cal: 14 mg/dL (ref 5–40)

## 2018-05-27 LAB — TSH: TSH: 1.3 u[IU]/mL (ref 0.450–4.500)

## 2018-05-29 LAB — PAP IG W/ RFLX HPV ASCU

## 2018-06-25 ENCOUNTER — Ambulatory Visit: Payer: Self-pay | Admitting: *Deleted

## 2018-06-25 DIAGNOSIS — R1084 Generalized abdominal pain: Secondary | ICD-10-CM | POA: Diagnosis not present

## 2018-06-25 NOTE — Telephone Encounter (Signed)
Attempted to contact pt; her husband, Ebony [name], answered and states that she is in the shower; he says that the pt is having abdominal pains and cramps that started 06/24/18; EMS was called but the pt was not transported to the hospital; explained that ideally the pt needs to speak with triage RN so that her symptoms can be assessed; her husband would like to schedule an appointment; there is no availability in the office today; she [facility]; unable to complete nurse triage because the pt's husband hung up.      Reason for Disposition . RN needs further essential information from caller in order to complete triage  Answer Assessment - Initial Assessment Questions 1. LOCATION: "Where does it hurt?"      Unable to assess 2. RADIATION: "Does the pain shoot anywhere else?" (e.g., chest, back)     Unable to assess 3. ONSET: "When did the pain begin?" (e.g., minutes, hours or days ago)   06/24/18 4. SUDDEN: "Gradual or sudden onset?"     Unable to asses 5. PATTERN "Does the pain come and go, or is it constant?"    - If constant: "Is it getting better, staying the same, or worsening?"      (Note: Constant means the pain never goes away completely; most serious pain is constant and it progresses)     - If intermittent: "How long does it last?" "Do you have pain now?"     (Note: Intermittent means the pain goes away completely between bouts)     unable 6. SEVERITY: "How bad is the pain?"  (e.g., Scale 1-10; mild, moderate, or severe)   - MILD (1-3): doesn't interfere with normal activities, abdomen soft and not tender to touch    - MODERATE (4-7): interferes with normal activities or awakens from sleep, tender to touch    - SEVERE (8-10): excruciating pain, doubled over, unable to do any normal activities      Unable to assess 7. RECURRENT SYMPTOM: "Have you ever had this type of abdominal pain before?" If so, ask: "When was the last time?" and "What happened that time?"   unable to assess 8. CAUSE: "What do you think is causing the abdominal pain?"     Unable to assess 9. RELIEVING/AGGRAVATING FACTORS: "What makes it better or worse?" (e.g., movement, antacids, bowel movement)     Unable to assess 10. OTHER SYMPTOMS: "Has there been any vomiting, diarrhea, constipation, or urine problems?"       diarrhea 11. PREGNANCY: "Is there any chance you are pregnant?" "When was your last menstrual period?"       Unable to assess  Protocols used: INFORMATION ONLY CALL-A-AH, [facility]

## 2018-06-30 ENCOUNTER — Ambulatory Visit: Payer: Self-pay | Admitting: Cardiovascular Disease

## 2018-07-02 ENCOUNTER — Encounter: Payer: Self-pay | Admitting: Cardiovascular Disease

## 2018-07-10 ENCOUNTER — Ambulatory Visit: Payer: BLUE CROSS/BLUE SHIELD | Admitting: Family Medicine

## 2019-02-28 DIAGNOSIS — M5442 Lumbago with sciatica, left side: Secondary | ICD-10-CM | POA: Diagnosis not present

## 2019-03-15 DIAGNOSIS — M545 Low back pain: Secondary | ICD-10-CM | POA: Diagnosis not present

## 2019-03-15 DIAGNOSIS — M5416 Radiculopathy, lumbar region: Secondary | ICD-10-CM | POA: Diagnosis not present

## 2019-03-18 DIAGNOSIS — M545 Low back pain: Secondary | ICD-10-CM | POA: Diagnosis not present

## 2019-03-24 DIAGNOSIS — M5136 Other intervertebral disc degeneration, lumbar region: Secondary | ICD-10-CM | POA: Diagnosis not present

## 2019-03-31 ENCOUNTER — Other Ambulatory Visit: Payer: Self-pay | Admitting: Orthopedic Surgery

## 2019-03-31 DIAGNOSIS — M5136 Other intervertebral disc degeneration, lumbar region: Secondary | ICD-10-CM

## 2019-04-07 ENCOUNTER — Other Ambulatory Visit: Payer: Medicaid Other

## 2019-04-07 DIAGNOSIS — M545 Low back pain: Secondary | ICD-10-CM | POA: Diagnosis not present

## 2019-04-08 ENCOUNTER — Other Ambulatory Visit: Payer: Self-pay | Admitting: Orthopedic Surgery

## 2019-04-08 ENCOUNTER — Ambulatory Visit
Admission: RE | Admit: 2019-04-08 | Discharge: 2019-04-08 | Disposition: A | Discharge status: Home / Self Care | Payer: BC Managed Care – PPO | Source: Ambulatory Visit | Attending: Orthopedic Surgery | Admitting: Orthopedic Surgery

## 2019-04-08 ENCOUNTER — Other Ambulatory Visit: Payer: Self-pay

## 2019-04-08 DIAGNOSIS — M5126 Other intervertebral disc displacement, lumbar region: Secondary | ICD-10-CM | POA: Diagnosis not present

## 2019-04-08 DIAGNOSIS — M5136 Other intervertebral disc degeneration, lumbar region: Secondary | ICD-10-CM

## 2019-04-08 MED ORDER — METHYLPREDNISOLONE ACETATE 40 MG/ML INJ SUSP (RADIOLOG
120.0000 mg | Freq: Once | INTRAMUSCULAR | Status: AC
  Administered 2019-04-08: 13:00:00 120 mg via EPIDURAL

## 2019-04-08 MED ORDER — IOPAMIDOL (ISOVUE-M 200) INJECTION 41%
1.0000 mL | Freq: Once | INTRAMUSCULAR | Status: AC
  Administered 2019-04-08: 13:00:00 1 mL via EPIDURAL

## 2019-04-08 NOTE — Discharge Instructions (Signed)

## 2019-06-29 ENCOUNTER — Other Ambulatory Visit: Payer: Self-pay

## 2019-06-29 ENCOUNTER — Ambulatory Visit (INDEPENDENT_AMBULATORY_CARE_PROVIDER_SITE_OTHER): Payer: BC Managed Care – PPO | Admitting: Emergency Medicine

## 2019-06-29 ENCOUNTER — Encounter: Payer: Self-pay | Admitting: Emergency Medicine

## 2019-06-29 VITALS — BP 109/73 | HR 76 | Temp 98.5°F | Resp 16 | Ht 65.0 in | Wt 189.0 lb

## 2019-06-29 DIAGNOSIS — Z13220 Encounter for screening for lipoid disorders: Secondary | ICD-10-CM | POA: Diagnosis not present

## 2019-06-29 DIAGNOSIS — Z0000 Encounter for general adult medical examination without abnormal findings: Secondary | ICD-10-CM | POA: Diagnosis not present

## 2019-06-29 DIAGNOSIS — Z130 Encounter for screening for diseases of the blood and blood-forming organs and certain disorders involving the immune mechanism: Secondary | ICD-10-CM

## 2019-06-29 DIAGNOSIS — Z13228 Encounter for screening for other metabolic disorders: Secondary | ICD-10-CM

## 2019-06-29 DIAGNOSIS — R42 Dizziness and giddiness: Secondary | ICD-10-CM | POA: Diagnosis not present

## 2019-06-29 DIAGNOSIS — Z1329 Encounter for screening for other suspected endocrine disorder: Secondary | ICD-10-CM

## 2019-06-29 NOTE — Progress Notes (Signed)
Ebony [name] 38 y.o.   Chief Complaint  Patient presents with   Annual Exam    HISTORY OF PRESENT ILLNESS: This is a 38 y.o. female here for her annual exam. No complaints or medical concerns today. No chronic medical problems.  No chronic medications. Non-smoker and no EtOH user. Works regular hours.  Wife and mother of 2.  Very busy and stressed out. Health maintenance reviewed with patient. Overall healthy female with healthy lifestyle.  HPI   Prior to Admission medications   Medication Sig Start Date End Date Taking? Authorizing Provider  acetaminophen (TYLENOL) 325 MG tablet Take 325-650 mg by mouth every 6 (six) hours as needed for moderate pain.   Yes [provider]    No Known Allergies  There are no problems to display for this patient.   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 ([place]) 06/14/2014   PONV (postoperative nausea and vomiting)     Past Surgical History:  Procedure Laterality Date   CESAREAN SECTION  12/2008   CESAREAN SECTION N/A 08/24/2012   Procedure: CESAREAN SECTION;  Surgeon: Sharene Butters, MD;  Location: [facility];  Service: Obstetrics;  Laterality: N/A;   CESAREAN SECTION N/A 07/29/2014   Procedure: CESAREAN SECTION;  Surgeon: Sanjuana Kava, MD;  Location: [facility];  Service: Obstetrics;  Laterality: N/A;   CESAREAN SECTION     WISDOM TOOTH EXTRACTION      Social History   Socioeconomic History   Marital status: Married    Spouse name: Not on file   Number of children: Not on file   Years of education: Not on file   Highest education level: Not on file  Occupational History   Not on file  Tobacco Use   Smoking status: Never Smoker   Smokeless tobacco: Never Used  Substance and Sexual Activity   Alcohol use: No   Drug use: No   Sexual activity: Yes   Birth control/protection: None    Comment: pregnant  Other Topics Concern   Not on file  Social History Narrative   ** Merged History Encounter **       Social Determinants of Health   Financial Resource Strain:    Difficulty of Paying Living Expenses: Not on file  Food Insecurity:    Worried About Charity fundraiser in the Last Year: Not on file   [employer] of Food in the Last Year: Not on file  Transportation Needs:    Lack of Transportation (Medical): Not on file   Lack of Transportation (Non-Medical): Not on file  Physical Activity:    Days of Exercise per Week: Not on file   Minutes of Exercise per Session: Not on file  Stress:    Feeling of Stress : Not on file  Social Connections:    Frequency of Communication with Friends and Family: Not on file   Frequency of Social Gatherings with Friends and Family: Not on file   Attends Religious Services: Not on file   Active Member of Clubs or Organizations: Not on file   Attends Archivist Meetings: Not on file   Marital Status: Not on file  Intimate Partner Violence:    Fear of Current or Ex-Partner: Not on file   Emotionally Abused: Not on file   Physically Abused: Not on  file   Sexually Abused: Not on file    History reviewed. No pertinent family history.   Review of Systems  Constitutional: Negative.  Negative for chills, fever and weight loss.  HENT: Negative.  Negative for congestion and sore throat.   Respiratory: Negative.  Negative for cough and shortness of breath.   Cardiovascular: Negative.  Negative for chest pain, palpitations and leg swelling.  Gastrointestinal: Negative.  Negative for abdominal pain, diarrhea, melena, nausea and vomiting.  Genitourinary: Negative.  Negative for hematuria.  Musculoskeletal: Negative.  Negative for myalgias.  Skin: Negative.  Negative for rash.  Neurological: Positive for dizziness and weakness. Negative for speech change, focal weakness and  headaches.  Endo/Heme/Allergies: Negative.   All other systems reviewed and are negative.     Today's Vitals   06/29/19 1324  BP: 109/73  Pulse: 76  Resp: 16  Temp: 98.5 F (36.9 C)  TempSrc: Oral  SpO2: 98%  Weight: 189 lb (85.7 kg)  Height: 5\' 5"  (1.651 m)   Body mass index is 31.45 kg/m.  Physical Exam Vitals reviewed.  Constitutional:      Appearance: Normal appearance.  HENT:     Head: Normocephalic.  Eyes:     Extraocular Movements: Extraocular movements intact.     Conjunctiva/sclera: Conjunctivae normal.     Pupils: Pupils are equal, round, and reactive to light.  Cardiovascular:     Rate and Rhythm: Normal rate and regular rhythm.     Pulses: Normal pulses.     Heart sounds: Normal heart sounds.  Pulmonary:     Effort: Pulmonary effort is normal.     Breath sounds: Normal breath sounds.  Abdominal:     General: Bowel sounds are normal. There is no distension.     Palpations: Abdomen is soft. There is no mass.     Tenderness: There is no abdominal tenderness. There is no right CVA tenderness or left CVA tenderness.  Musculoskeletal:        General: No tenderness.     Cervical back: Normal range of motion and neck supple.     Right lower leg: No edema.     Left lower leg: No edema.  Skin:    General: Skin is warm and dry.     Capillary Refill: Capillary refill takes less than 2 seconds.  Neurological:     General: No focal deficit present.     Mental Status: She is alert and oriented to person, place, and time.  Psychiatric:        Mood and Affect: Mood normal.        Behavior: Behavior normal.      ASSESSMENT & PLAN: Ebony [name] was seen today for annual exam.  Diagnoses and all orders for this visit:  Routine general medical examination at a health care facility  Screening for deficiency anemia -     CBC with Differential  Screening for lipoid disorders -     Lipid panel  Screening for endocrine, metabolic and immunity disorder -      Comprehensive metabolic panel -     Hemoglobin A1c -     TSH    Patient Instructions       If you have lab work done today you will be contacted with your lab results within the next 2 weeks.  If you have not heard from us then please contact us. The fastest way to get your results is to register for My Chart.   IF you received an  x-ray today, you will receive an invoice from [facility]. Please contact [facility] at [phone] with questions or concerns regarding your invoice.   IF you received labwork today, you will receive an invoice from [facility]. Please contact [facility] at [phone] with questions or concerns regarding your invoice.   Our billing staff will not be able to assist you with questions regarding bills from these companies.  You will be contacted with the lab results as soon as they are available. The fastest way to get your results is to activate your My Chart account. Instructions are located on the last page of this paperwork. If you have not heard from [place] regarding the results in 2 weeks, please contact this office.      Health Maintenance, Female Adopting a healthy lifestyle and getting preventive care are important in promoting health and wellness. Ask your health care provider about:  The right schedule for you to have regular tests and exams.  Things you can do on your own to prevent diseases and keep yourself healthy. What should I know about diet, weight, and exercise? Eat a healthy diet   Eat a diet that includes plenty of vegetables, fruits, low-fat dairy products, and lean protein.  Do not eat a lot of foods that are high in solid fats, added sugars, or sodium. Maintain a healthy weight Body mass index (BMI) is used to identify weight problems. It estimates body fat based on height and weight. Your health care provider can help determine your BMI and help you achieve or maintain a healthy weight. Get regular exercise Get  regular exercise. This is one of the most important things you can do for your health. Most adults should:  Exercise for at least 150 minutes each week. The exercise should increase your heart rate and make you sweat (moderate-intensity exercise).  Do strengthening exercises at least twice a week. This is in addition to the moderate-intensity exercise.  Spend less time sitting. Even light physical activity can be beneficial. Watch cholesterol and blood lipids Have your blood tested for lipids and cholesterol at 40 years of age, then have this test every 5 years. Have your cholesterol levels checked more often if:  Your lipid or cholesterol levels are high.  You are older than 40 years of age.  You are at high risk for heart disease. What should I know about cancer screening? Depending on your health history and family history, you may need to have cancer screening at various ages. This may include screening for:  Breast cancer.  Cervical cancer.  Colorectal cancer.  Skin cancer.  Lung cancer. What should I know about heart disease, diabetes, and high blood pressure? Blood pressure and heart disease  High blood pressure causes heart disease and increases the risk of stroke. This is more likely to develop in people who have high blood pressure readings, are of African descent, or are overweight.  Have your blood pressure checked: ? Every 3-5 years if you are 11-31 years of age. ? Every year if you are 28 years old or older. Diabetes Have regular diabetes screenings. This checks your fasting blood sugar level. Have the screening done:  Once every three years after age 71 if you are at a normal weight and have a low risk for diabetes.  More often and at a younger age if you are overweight or have a high risk for diabetes. What should I know about preventing infection? Hepatitis B If you have a higher risk for  hepatitis B, you should be screened for this virus. Talk with your  health care provider to find out if you are at risk for hepatitis B infection. Hepatitis C Testing is recommended for:  Everyone born from [birth year] through 1967.  Anyone with known risk factors for hepatitis C. Sexually transmitted infections (STIs)  Get screened for STIs, including gonorrhea and chlamydia, if: ? You are sexually active and are younger than 40 years of age. ? You are older than 40 years of age and your health care provider tells you that you are at risk for this type of infection. ? Your sexual activity has changed since you were last screened, and you are at increased risk for chlamydia or gonorrhea. Ask your health care provider if you are at risk.  Ask your health care provider about whether you are at high risk for HIV. Your health care provider may recommend a prescription medicine to help prevent HIV infection. If you choose to take medicine to prevent HIV, you should first get tested for HIV. You should then be tested every 3 months for as long as you are taking the medicine. Pregnancy  If you are about to stop having your period (premenopausal) and you may become pregnant, seek counseling before you get pregnant.  Take 400 to 800 micrograms (mcg) of folic acid every day if you become pregnant.  Ask for birth control (contraception) if you want to prevent pregnancy. Osteoporosis and menopause Osteoporosis is a disease in which the bones lose minerals and strength with aging. This can result in bone fractures. If you are 45 years old or older, or if you are at risk for osteoporosis and fractures, ask your health care provider if you should:  Be screened for bone loss.  Take a calcium or vitamin D supplement to lower your risk of fractures.  Be given hormone replacement therapy (HRT) to treat symptoms of menopause. Follow these instructions at home: Lifestyle  Do not use any products that contain nicotine or tobacco, such as cigarettes, e-cigarettes, and chewing  tobacco. If you need help quitting, ask your health care provider.  Do not use street drugs.  Do not share needles.  Ask your health care provider for help if you need support or information about quitting drugs. Alcohol use  Do not drink alcohol if: ? Your health care provider tells you not to drink. ? You are pregnant, may be pregnant, or are planning to become pregnant.  If you drink alcohol: ? Limit how much you use to 0-1 drink a day. ? Limit intake if you are breastfeeding.  Be aware of how much alcohol is in your drink. In the [place] one drink equals one 12 oz bottle of beer (355 mL), one 5 oz glass of wine (148 mL), or one 1 oz glass of hard liquor (44 mL). General instructions  Schedule regular health, dental, and eye exams.  Stay current with your vaccines.  Tell your health care provider if: ? You often feel depressed. ? You have ever been abused or do not feel safe at home. Summary  Adopting a healthy lifestyle and getting preventive care are important in promoting health and wellness.  Follow your health care provider's instructions about healthy diet, exercising, and getting tested or screened for diseases.  Follow your health care provider's instructions on monitoring your cholesterol and blood pressure. This information is not intended to replace advice given to you by your health care provider. Make sure you discuss any questions  you have with your health care provider. Document Released: 12/31/2010 Document Revised: 06/10/2018 Document Reviewed: 06/10/2018 Elsevier Patient Education  2020 [employer]      Edwina Barth, MD [facility]

## 2019-06-29 NOTE — Patient Instructions (Addendum)
   If you have lab work done today you will be contacted with your lab results within the next 2 weeks.  If you have not heard from us then please contact us. The fastest way to get your results is to register for My Chart.   IF you received an x-ray today, you will receive an invoice from [facility]. Please contact [facility] at [phone] with questions or concerns regarding your invoice.   IF you received labwork today, you will receive an invoice from [facility]. Please contact [facility] at [phone] with questions or concerns regarding your invoice.   Our billing staff will not be able to assist you with questions regarding bills from these companies.  You will be contacted with the lab results as soon as they are available. The fastest way to get your results is to activate your My Chart account. Instructions are located on the last page of this paperwork. If you have not heard from us regarding the results in 2 weeks, please contact this office.      Health Maintenance, Female Adopting a healthy lifestyle and getting preventive care are important in promoting health and wellness. Ask your health care provider about:  The right schedule for you to have regular tests and exams.  Things you can do on your own to prevent diseases and keep yourself healthy. What should I know about diet, weight, and exercise? Eat a healthy diet   Eat a diet that includes plenty of vegetables, fruits, low-fat dairy products, and lean protein.  Do not eat a lot of foods that are high in solid fats, added sugars, or sodium. Maintain a healthy weight Body mass index (BMI) is used to identify weight problems. It estimates body fat based on height and weight. Your health care provider can help determine your BMI and help you achieve or maintain a healthy weight. Get regular exercise Get regular exercise. This is one of the most important things you can do for your health. Most  adults should:  Exercise for at least 150 minutes each week. The exercise should increase your heart rate and make you sweat (moderate-intensity exercise).  Do strengthening exercises at least twice a week. This is in addition to the moderate-intensity exercise.  Spend less time sitting. Even light physical activity can be beneficial. Watch cholesterol and blood lipids Have your blood tested for lipids and cholesterol at 40 years of age, then have this test every 5 years. Have your cholesterol levels checked more often if:  Your lipid or cholesterol levels are high.  You are older than 40 years of age.  You are at high risk for heart disease. What should I know about cancer screening? Depending on your health history and family history, you may need to have cancer screening at various ages. This may include screening for:  Breast cancer.  Cervical cancer.  Colorectal cancer.  Skin cancer.  Lung cancer. What should I know about heart disease, diabetes, and high blood pressure? Blood pressure and heart disease  High blood pressure causes heart disease and increases the risk of stroke. This is more likely to develop in people who have high blood pressure readings, are of African descent, or are overweight.  Have your blood pressure checked: ? Every 3-5 years if you are 18-39 years of age. ? Every year if you are 40 years old or older. Diabetes Have regular diabetes screenings. This checks your fasting blood sugar level. Have the screening done:  Once every   three years after age 40 if you are at a normal weight and have a low risk for diabetes.  More often and at a younger age if you are overweight or have a high risk for diabetes. What should I know about preventing infection? Hepatitis B If you have a higher risk for hepatitis B, you should be screened for this virus. Talk with your health care provider to find out if you are at risk for hepatitis B infection. Hepatitis  C Testing is recommended for:  Everyone born from 1945 through 1965.  Anyone with known risk factors for hepatitis C. Sexually transmitted infections (STIs)  Get screened for STIs, including gonorrhea and chlamydia, if: ? You are sexually active and are younger than 40 years of age. ? You are older than 40 years of age and your health care provider tells you that you are at risk for this type of infection. ? Your sexual activity has changed since you were last screened, and you are at increased risk for chlamydia or gonorrhea. Ask your health care provider if you are at risk.  Ask your health care provider about whether you are at high risk for HIV. Your health care provider may recommend a prescription medicine to help prevent HIV infection. If you choose to take medicine to prevent HIV, you should first get tested for HIV. You should then be tested every 3 months for as long as you are taking the medicine. Pregnancy  If you are about to stop having your period (premenopausal) and you may become pregnant, seek counseling before you get pregnant.  Take 400 to 800 micrograms (mcg) of folic acid every day if you become pregnant.  Ask for birth control (contraception) if you want to prevent pregnancy. Osteoporosis and menopause Osteoporosis is a disease in which the bones lose minerals and strength with aging. This can result in bone fractures. If you are 65 years old or older, or if you are at risk for osteoporosis and fractures, ask your health care provider if you should:  Be screened for bone loss.  Take a calcium or vitamin D supplement to lower your risk of fractures.  Be given hormone replacement therapy (HRT) to treat symptoms of menopause. Follow these instructions at home: Lifestyle  Do not use any products that contain nicotine or tobacco, such as cigarettes, e-cigarettes, and chewing tobacco. If you need help quitting, ask your health care provider.  Do not use street  drugs.  Do not share needles.  Ask your health care provider for help if you need support or information about quitting drugs. Alcohol use  Do not drink alcohol if: ? Your health care provider tells you not to drink. ? You are pregnant, may be pregnant, or are planning to become pregnant.  If you drink alcohol: ? Limit how much you use to 0-1 drink a day. ? Limit intake if you are breastfeeding.  Be aware of how much alcohol is in your drink. In the [place] one drink equals one 12 oz bottle of beer (355 mL), one 5 oz glass of wine (148 mL), or one 1 oz glass of hard liquor (44 mL). General instructions  Schedule regular health, dental, and eye exams.  Stay current with your vaccines.  Tell your health care provider if: ? You often feel depressed. ? You have ever been abused or do not feel safe at home. Summary  Adopting a healthy lifestyle and getting preventive care are important in promoting health and wellness.    Follow your health care provider's instructions about healthy diet, exercising, and getting tested or screened for diseases.  Follow your health care provider's instructions on monitoring your cholesterol and blood pressure. This information is not intended to replace advice given to you by your health care provider. Make sure you discuss any questions you have with your health care provider. Document Released: 12/31/2010 Document Revised: 06/10/2018 Document Reviewed: 06/10/2018 Elsevier Patient Education  2020 Elsevier Inc.  

## 2019-06-30 LAB — COMPREHENSIVE METABOLIC PANEL
ALT: 31 IU/L (ref 0–32)
AST: 18 IU/L (ref 0–40)
Albumin/Globulin Ratio: 1.4 (ref 1.2–2.2)
Albumin: 4.4 g/dL (ref 3.8–4.8)
Alkaline Phosphatase: 59 IU/L (ref 39–117)
BUN/Creatinine Ratio: 23 (ref 9–23)
BUN: 13 mg/dL (ref 6–24)
Bilirubin Total: 0.5 mg/dL (ref 0.0–1.2)
CO2: 21 mmol/L (ref 20–29)
Calcium: 9.1 mg/dL (ref 8.7–10.2)
Chloride: 100 mmol/L (ref 96–106)
Creatinine, Ser: 0.57 mg/dL (ref 0.57–1.00)
GFR calc Af Amer: 134 mL/min/{1.73_m2} (ref >59)
GFR calc non Af Amer: 116 mL/min/{1.73_m2} (ref >59)
Globulin, Total: 3.1 g/dL (ref 1.5–4.5)
Glucose: 85 mg/dL (ref 65–99)
Potassium: 3.9 mmol/L (ref 3.5–5.2)
Sodium: 139 mmol/L (ref 134–144)
Total Protein: 7.5 g/dL (ref 6.0–8.5)

## 2019-06-30 LAB — CBC WITH DIFFERENTIAL/PLATELET
Basophils Absolute: 0.1 10*3/uL (ref 0.0–0.2)
Basos: 1 %
EOS (ABSOLUTE): 0 10*3/uL (ref 0.0–0.4)
Eos: 0 %
Hematocrit: 38.1 % (ref 34.0–46.6)
Hemoglobin: 12.5 g/dL (ref 11.1–15.9)
Immature Grans (Abs): 0 10*3/uL (ref 0.0–0.1)
Immature Granulocytes: 0 %
Lymphocytes Absolute: 1.9 10*3/uL (ref 0.7–3.1)
Lymphs: 25 %
MCH: 27.9 pg (ref 26.6–33.0)
MCHC: 32.8 g/dL (ref 31.5–35.7)
MCV: 85 fL (ref 79–97)
Monocytes Absolute: 0.4 10*3/uL (ref 0.1–0.9)
Monocytes: 6 %
Neutrophils Absolute: 5.1 10*3/uL (ref 1.4–7.0)
Neutrophils: 68 %
Platelets: 151 10*3/uL (ref 150–450)
RBC: 4.48 x10E6/uL (ref 3.77–5.28)
RDW: 12 % (ref 11.7–15.4)
WBC: 7.5 10*3/uL (ref 3.4–10.8)

## 2019-06-30 LAB — LIPID PANEL
Chol/HDL Ratio: 3.2 ratio (ref 0.0–4.4)
Cholesterol, Total: 180 mg/dL (ref 100–199)
HDL: 56 mg/dL (ref >39)
LDL Chol Calc (NIH): 112 mg/dL — ABNORMAL HIGH (ref 0–99)
Triglycerides: 60 mg/dL (ref 0–149)
VLDL Cholesterol Cal: 12 mg/dL (ref 5–40)

## 2019-06-30 LAB — HEMOGLOBIN A1C
Est. average glucose Bld gHb Est-mCnc: 103 mg/dL
Hgb A1c MFr Bld: 5.2 % (ref 4.8–5.6)

## 2019-06-30 LAB — TSH: TSH: 1.32 u[IU]/mL (ref 0.450–4.500)

## 2019-07-09 ENCOUNTER — Encounter: Payer: Self-pay | Admitting: Radiology

## 2019-11-08 DIAGNOSIS — G4489 Other headache syndrome: Secondary | ICD-10-CM | POA: Diagnosis not present

## 2019-11-08 DIAGNOSIS — E860 Dehydration: Secondary | ICD-10-CM | POA: Diagnosis not present

## 2019-11-08 DIAGNOSIS — R52 Pain, unspecified: Secondary | ICD-10-CM | POA: Diagnosis not present

## 2019-11-08 DIAGNOSIS — R58 Hemorrhage, not elsewhere classified: Secondary | ICD-10-CM | POA: Diagnosis not present

## 2019-12-02 DIAGNOSIS — A084 Viral intestinal infection, unspecified: Secondary | ICD-10-CM | POA: Diagnosis not present

## 2019-12-02 DIAGNOSIS — R197 Diarrhea, unspecified: Secondary | ICD-10-CM | POA: Diagnosis not present

## 2019-12-02 DIAGNOSIS — R42 Dizziness and giddiness: Secondary | ICD-10-CM | POA: Diagnosis not present

## 2019-12-02 DIAGNOSIS — R112 Nausea with vomiting, unspecified: Secondary | ICD-10-CM | POA: Diagnosis not present

## 2020-01-18 ENCOUNTER — Other Ambulatory Visit: Payer: Self-pay

## 2020-01-18 ENCOUNTER — Other Ambulatory Visit: Payer: Self-pay | Admitting: Nurse Practitioner

## 2020-01-18 ENCOUNTER — Encounter (HOSPITAL_COMMUNITY): Payer: Self-pay | Admitting: Emergency Medicine

## 2020-01-18 ENCOUNTER — Emergency Department (HOSPITAL_COMMUNITY): Payer: BC Managed Care – PPO

## 2020-01-18 ENCOUNTER — Emergency Department (HOSPITAL_COMMUNITY)
Admission: EM | Admit: 2020-01-18 | Discharge: 2020-01-18 | Disposition: A | Discharge status: Home / Self Care | Payer: BC Managed Care – PPO | Attending: Emergency Medicine | Admitting: Emergency Medicine

## 2020-01-18 DIAGNOSIS — Z79899 Other long term (current) drug therapy: Secondary | ICD-10-CM | POA: Diagnosis not present

## 2020-01-18 DIAGNOSIS — I471 Supraventricular tachycardia: Secondary | ICD-10-CM

## 2020-01-18 DIAGNOSIS — I10 Essential (primary) hypertension: Secondary | ICD-10-CM | POA: Insufficient documentation

## 2020-01-18 DIAGNOSIS — R079 Chest pain, unspecified: Secondary | ICD-10-CM

## 2020-01-18 DIAGNOSIS — R000 Tachycardia, unspecified: Secondary | ICD-10-CM | POA: Diagnosis not present

## 2020-01-18 DIAGNOSIS — R0602 Shortness of breath: Secondary | ICD-10-CM | POA: Diagnosis not present

## 2020-01-18 DIAGNOSIS — I248 Other forms of acute ischemic heart disease: Secondary | ICD-10-CM | POA: Diagnosis not present

## 2020-01-18 HISTORY — DX: Supraventricular tachycardia, unspecified: I47.10

## 2020-01-18 HISTORY — DX: Supraventricular tachycardia: I47.1

## 2020-01-18 LAB — COMPREHENSIVE METABOLIC PANEL
ALT: 25 U/L (ref 0–44)
AST: 21 U/L (ref 15–41)
Albumin: 4.1 g/dL (ref 3.5–5.0)
Alkaline Phosphatase: 47 U/L (ref 38–126)
Anion gap: 10 (ref 5–15)
BUN: 13 mg/dL (ref 6–20)
CO2: 23 mmol/L (ref 22–32)
Calcium: 9 mg/dL (ref 8.9–10.3)
Chloride: 106 mmol/L (ref 98–111)
Creatinine, Ser: 0.63 mg/dL (ref 0.44–1.00)
GFR calc Af Amer: >60 mL/min (ref >60)
GFR calc non Af Amer: >60 mL/min (ref >60)
Glucose, Bld: 137 mg/dL — ABNORMAL HIGH (ref 70–99)
Potassium: 3.4 mmol/L — ABNORMAL LOW (ref 3.5–5.1)
Sodium: 139 mmol/L (ref 135–145)
Total Bilirubin: 0.8 mg/dL (ref 0.3–1.2)
Total Protein: 7.2 g/dL (ref 6.5–8.1)

## 2020-01-18 LAB — CBC WITH DIFFERENTIAL/PLATELET
Abs Immature Granulocytes: 0.01 10*3/uL (ref 0.00–0.07)
Basophils Absolute: 0 10*3/uL (ref 0.0–0.1)
Basophils Relative: 0 %
Eosinophils Absolute: 0 10*3/uL (ref 0.0–0.5)
Eosinophils Relative: 0 %
HCT: 38.3 % (ref 36.0–46.0)
Hemoglobin: 11.8 g/dL — ABNORMAL LOW (ref 12.0–15.0)
Immature Granulocytes: 0 %
Lymphocytes Relative: 13 %
Lymphs Abs: 1.2 10*3/uL (ref 0.7–4.0)
MCH: 25.7 pg — ABNORMAL LOW (ref 26.0–34.0)
MCHC: 30.8 g/dL (ref 30.0–36.0)
MCV: 83.3 fL (ref 80.0–100.0)
Monocytes Absolute: 0.4 10*3/uL (ref 0.1–1.0)
Monocytes Relative: 5 %
Neutro Abs: 7.3 10*3/uL (ref 1.7–7.7)
Neutrophils Relative %: 82 %
Platelets: UNDETERMINED 10*3/uL (ref 150–400)
RBC: 4.6 MIL/uL (ref 3.87–5.11)
RDW: 14.2 % (ref 11.5–15.5)
WBC: 8.9 10*3/uL (ref 4.0–10.5)
nRBC: 0 % (ref 0.0–0.2)

## 2020-01-18 LAB — MAGNESIUM: Magnesium: 1.8 mg/dL (ref 1.7–2.4)

## 2020-01-18 LAB — TSH: TSH: 1.102 u[IU]/mL (ref 0.350–4.500)

## 2020-01-18 LAB — TROPONIN I (HIGH SENSITIVITY): Troponin I (High Sensitivity): 21 ng/L — ABNORMAL HIGH (ref <18)

## 2020-01-18 MED ORDER — METOPROLOL SUCCINATE ER 25 MG PO TB24
12.5000 mg | ORAL_TABLET | Freq: Once | ORAL | Status: AC
  Administered 2020-01-18: 12.5 mg via ORAL
  Filled 2020-01-18: qty 1

## 2020-01-18 MED ORDER — METOPROLOL SUCCINATE ER 25 MG PO TB24: 12.5000 mg | ORAL_TABLET | Freq: Once | ORAL | 3 refills | Status: DC

## 2020-01-18 MED ORDER — POTASSIUM CHLORIDE CRYS ER 20 MEQ PO TBCR
40.0000 meq | EXTENDED_RELEASE_TABLET | Freq: Once | ORAL | Status: AC
  Administered 2020-01-18: 40 meq via ORAL
  Filled 2020-01-18: qty 2

## 2020-01-18 NOTE — Consult Note (Addendum)
Cardiology Consult    Patient ID: Minami Arriaga MRN: [id number], DOB/AGE: 07-21-1980   Admit date: 01/18/2020 Date of Consult: 01/18/2020  Primary Physician: Myles Lipps, MD Primary Cardiologist: Pt to f/u with Dr. Elberta Fortis Requesting Provider: C. Tegeler, MD -> ER physician  Patient Profile    Ebony [name] is a 39 y.o. female with a history of palpitations and gestational hypertension, who is being seen today for the evaluation of PSVT at the request of Dr. Rush Landmark.  Past Medical History   Past Medical History:  Diagnosis Date  . Anemia    history  . Back pain   . Headache(784.0)    otc med prn  . Hypertension    gestational  . 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 Neuromuscular disorder (HCC)   . PONV (postoperative nausea and vomiting)   . PSVT (paroxysmal supraventricular tachycardia) (HCC)     Past Surgical History:  Procedure Laterality Date  . CESAREAN SECTION  12/2008  . CESAREAN SECTION N/A 08/24/2012   Procedure: CESAREAN SECTION;  Surgeon: Mickel Baas, MD;  Location: [facility];  Service: Obstetrics;  Laterality: N/A;  . CESAREAN SECTION N/A 07/29/2014   Procedure: CESAREAN SECTION;  Surgeon: Essie Hart, MD;  Location: [facility];  Service: Obstetrics;  Laterality: N/A;  . CESAREAN SECTION    . WISDOM TOOTH EXTRACTION       Allergies  No Known Allergies  History of Present Illness    39 year old female with a prior history of palpitations and gestational hypertension.  She notes palpitations history dates back at least 4 to 5 years and has occurred multiple times per year.  Symptoms typically occur spontaneously though she feels as though caffeine has been a trigger historically.  With palpitations, she experiences chest pain and mild dyspnea.  She has had to call EMS several times this year already with resolution of tachycardia following placement of oxygen.  Prior to today, she never required adenosine.  On  multiple occasions, she has been advised to present to the emergency department but she deferred as symptoms have historically resolved with oxygen.  She was in her usual state of health this afternoon approxi-1:30 PM, when she was cooking in the kitchen and had sudden onset of tachypalpitations.  She went and laid down and began noticing left-sided chest pain and dyspnea.  She had never had such severe chest pain or tachycardia before.  She also felt very lightheaded and felt as though she could not get her words out.  She also noted left-sided numbness, which she typically experiences when tachypalpitations occur.  She contacted her husband who then called EMS.  Upon EMS arrival, she was found to be in supraventricular tachycardia at rates in the 180s or greater.  Vagal maneuvers were attempted x2 and were unsuccessful.  She was then given 6 mg of IV adenosine.  She had 3 beats of nonsustained VT and then subsequently converted to sinus rhythm.  All symptoms resolved following conversion to sinus rhythm.  [facility] was [facility] ED, where labs were notable for mild hypokalemia with a potassium of 3.4.  High-sensitivity troponin is minimally elevated at 21.  TSH is normal at 1.102.  She does not wish to be admitted and we have been asked to consult.  Review of her twelve-lead ECG shows some suggestion of delta waves.  She has had no recurrent arrhythmias since arrival.  Inpatient Medications    . metoprolol succinate  12.5 mg  Oral Once    Family History    Family History  Problem Relation Age of Onset  . Stroke Mother    She indicated that her mother is alive. She indicated that her father is deceased.   Social History    Social History   Socioeconomic History  . Marital status: Married    Spouse name: Not on file  . Number of children: Not on file  . Years of education: Not on file  . Highest education level: Not on file  Occupational History  . Not on file  Tobacco Use  . Smoking  status: Never Smoker  . Smokeless tobacco: Never Used  Substance and Sexual Activity  . Alcohol use: No  . Drug use: No  . Sexual activity: Yes    Birth control/protection: None    Comment: pregnant  Other Topics Concern  . Not on file  Social History Narrative   Lives locally with husband.     Social Determinants of Health   Financial Resource Strain:   . Difficulty of Paying Living Expenses:   Food Insecurity:   . Worried About Programme researcher, broadcasting/film/video in the Last Year:   . Barista in the Last Year:   Transportation Needs:   . Freight forwarder (Medical):   Marland Kitchen Lack of Transportation (Non-Medical):   Physical Activity:   . Days of Exercise per Week:   . Minutes of Exercise per Session:   Stress:   . Feeling of Stress :   Social Connections:   . Frequency of Communication with Friends and Family:   . Frequency of Social Gatherings with Friends and Family:   . Attends Religious Services:   . Active Member of Clubs or Organizations:   . Attends Banker Meetings:   Marland Kitchen Marital Status:   Intimate Partner Violence:   . Fear of Current or Ex-Partner:   . Emotionally Abused:   Marland Kitchen Physically Abused:   . Sexually Abused:      Review of Systems    General:  No chills, fever, night sweats or weight changes.  Cardiovascular:  +++ chest pain, +++ dyspnea, +++ tachypalps, +++ presyncope, no edema, orthopnea, palpitations, paroxysmal nocturnal dyspnea. Dermatological: No rash, lesions/masses Respiratory: No cough, +++ dyspnea Urologic: No hematuria, dysuria Abdominal:   No nausea, vomiting, diarrhea, bright red blood per rectum, melena, or hematemesis Neurologic:  No visual changes, wkns, changes in mental status. All other systems reviewed and are otherwise negative except as noted above.  Physical Exam    Blood pressure 115/80, pulse 88, temperature 98 F (36.7 C), temperature source Oral, resp. rate 18, height 5\' 5"  (1.651 m), weight 85.7 kg, SpO2 100 %.    General: Pleasant, NAD Psych: Normal affect. Neuro: Alert and oriented X 3. Moves all extremities spontaneously. HEENT: Normal  Neck: Supple without bruits or JVD. Lungs:  Resp regular and unlabored, CTA. Heart: RRR no s3, s4, or murmurs. Abdomen: Soft, non-tender, non-distended, BS + x 4.  Extremities: No clubbing, cyanosis or edema. DP/PT/Radials 2+ and equal bilaterally.  Labs    Cardiac Enzymes Recent Labs  Lab 01/18/20 1520  TROPONINIHS 21*      Lab Results  Component Value Date   WBC 8.9 01/18/2020   HGB 11.8 (L) 01/18/2020   HCT 38.3 01/18/2020   MCV 83.3 01/18/2020   PLT PLATELET CLUMPS NOTED ON SMEAR, UNABLE TO ESTIMATE 01/18/2020    Recent Labs  Lab 01/18/20 1520  NA 139  K  3.4*  CL 106  CO2 23  BUN 13  CREATININE 0.63  CALCIUM 9.0  PROT 7.2  BILITOT 0.8  ALKPHOS 47  ALT 25  AST 21  GLUCOSE 137*   Lab Results  Component Value Date   CHOL 180 06/29/2019   HDL 56 06/29/2019   LDLCALC 112 (H) 06/29/2019   TRIG 60 06/29/2019    Radiology Studies    DG Chest Portable 1 View  Result Date: 01/18/2020 CLINICAL DATA:  Left-sided chest pain EXAM: PORTABLE CHEST 1 VIEW COMPARISON:  None. FINDINGS: The heart size and mediastinal contours are within normal limits. Both lungs are clear. The visualized skeletal structures are unremarkable. IMPRESSION: No active disease. Electronically Signed   By: Jasmine Pang M.D.   On: 01/18/2020 17:45    ECG & Cardiac Imaging    EMS strips reviewed showing supraventricular tachycardia at a rate of approximate 180 bpm followed by 3 beats of nonsustained ventricular tachycardia and then sinus tachycardia - personally reviewed. Twelve-lead ECG here-sinus tachycardia, 102, question delta wave morphology.  No acute ST or T changes.  Assessment & Plan    1.  PSVT: Patient with a history of tachypalpitations for which she has had to call EMS several times over the past few years.  She had recurrent tachypalpitations today  associated with chest pain, left-sided numbness, dyspnea, and presyncope.  She was found to be in SVT at a rate of approximately 180 bpm which did not respond to vagal maneuvers.  She was given adenosine 6 mg IV x1 with prompt resolution.  In the ED, she has been maintaining sinus rhythm to sinus tachycardia.  Lab work notable for potassium 3.4 and high-sensitivity troponin of 21.  She is currently symptom-free and wishes to be discharged.  We have discussed her case with electrophysiology.  I will arrange for an outpatient 2D echocardiogram and EP follow-up.  Further, we will add metoprolol succinate 12.5 mg daily.  I have educated her on vagal maneuvers/bearing down.  Also, as caffeine seems to be a trigger for her, we have recommended avoidance.  2.  Hypokalemia: Potassium 3.4.  Supplement.  3.  Elevated troponin: Likely demand ischemia in the setting of tachycardia.  No family history of premature CAD or exertional symptoms otherwise.  Adding low-dose beta-blocker as above.  Would only pursue ischemic evaluation if echocardiogram abnormal.  Signed, Nicolasa Ducking, NP 01/18/2020, 6:16 PM  ATTENDING ATTESTATION  I have seen, examined and evaluated the patient this PM in the ER along with Nicolasa Ducking, NP.  After reviewing all the available data and chart, we discussed the patients laboratory, study & physical findings as well as symptoms in detail. I agree with his findings, examination as well as impression recommendations as per our discussion.    Exam and HPI performed together.  History provided by both the patient and her husband.  Attending adjustments noted in italics.   Otherwise healthy 39 year old woman with what sounds like about a 10-year history of having SVT episodes, more frequently in the last couple years.  Over the last few months has had several episodes.  Usually, she does not require coming to the emergency room and they were treated by EMS with oxygen.  This was  perhaps her worst episode where she had some discomfort in her chest and radiated to her left arm.  She was clearly near syncopal with a prodrome of syncope, not having lost consciousness.  She was found to be in rapid SVT that broke with  adenosine.  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  Given her age and lack of comorbidities, she is likely a good candidate for ablation.  I have discussed the case with Dr. 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  He is agreed to see her in follow-up.  Recommendation was check 2D echocardiogram prior to follow-up.  We will start low-dose Toprol 25 mg daily that she will take until seen by Dr. Elberta Fortisamnitz We discussed vagal maneuvers but also avoiding triggers.  She has mild troponin elevation which is relatively benign and can easily explained by her prolonged tachycardia.  Likely mild degree of ischemia.  Would not evaluate further.  I think she is fine for discharge home from the ER.   Discussed with Dr. Rush Landmarkegeler and ER APP    Bryan Lemmaavid Elnora Quizon, M.D., M.S. Interventional Cardiologist   Pager # [phone] Phone # [phone] [address] [place], [place] [address]     For questions or updates, please contact   Please consult [email or web address] for contact info under Cardiology/STEMI.

## 2020-01-18 NOTE — Discharge Instructions (Addendum)
We have provided you with medication that should help with your symptoms, please pick this medication up from the pharmacy and take accordingly.  You also have an appointment scheduled with the EP lab, follow-up with them for proper management of your palpitations.  If you experience any shortness of breath, chest pain, worsening symptoms please return to the emergency department.

## 2020-01-18 NOTE — ED Provider Notes (Signed)
[facility] EMERGENCY DEPARTMENT Provider Note   CSN: [id number] Arrival date & time: 01/18/20  1507     History Chief Complaint  Patient presents with   Tachycardia    Ebony [name] is a 39 y.o. female.  39 y.o female with a PMH of Anemia, HTN gestational, presents to the ED via EMS with a chief complaint of palpitations x 30 min prior to arrival. Symptoms began suddenly while patient was cooking with a friend, she reports feeling dizzy "like the room is spinning", this was accompanied by stabbing pain in her chest with radiation to the left chest. In addition, she began experiencing shortness of breath due to her "heart beating so fast". She received adenosine in the field and oxygen which resolved her symptoms. She has about 5-6 episodes similar to these were EMS has arrived at her home but this is her first time receiving adenosine. Now she endorses a mild headache and weakness all over. She does not have a prior history of arrhythmia but also does not have a PCP or cardiologist who she sees. No prior history of CAD, family hx of CAD, no clotting disorders, no smoking history.   The history is provided by the patient.       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   Past Surgical History:  Procedure Laterality Date   CESAREAN SECTION  12/2008   CESAREAN SECTION N/A 08/24/2012   Procedure: CESAREAN SECTION;  Surgeon: Mickel Baas, MD;  Location: [facility];  Service: Obstetrics;  Laterality: N/A;   CESAREAN SECTION N/A 07/29/2014   Procedure: CESAREAN SECTION;  Surgeon: Essie Hart,  MD;  Location: [facility];  Service: Obstetrics;  Laterality: N/A;   CESAREAN SECTION     WISDOM TOOTH EXTRACTION       OB History    Gravida  7   Para  6   Term  6   Preterm  0   AB  0   Living  2     SAB  0   TAB  0   Ectopic  0   Multiple      Live Births  2           Family History  Problem Relation Age of Onset   Stroke Mother     Social History   Tobacco Use   Smoking status: Never Smoker   Smokeless tobacco: Never Used  Substance Use Topics   Alcohol use: No   Drug use: No    Home Medications Prior to Admission medications   Medication Sig Start Date End Date Taking? Authorizing Provider  acetaminophen (TYLENOL) 325 MG tablet Take 325-650 mg by mouth every 6 (six) hours as needed for moderate pain.    [provider]  metoprolol succinate (TOPROL-XL) 25 MG 24 hr tablet Take 0.5 tablets (12.5 mg total) by mouth once for 1 dose. 01/18/20 01/18/20  Creig Hines, NP    Allergies    Patient has no known allergies.  Review of Systems   Review of Systems  Constitutional: Negative  for chills and fever.  HENT: Negative for sore throat.   Respiratory: Positive for shortness of breath.   Cardiovascular: Positive for chest pain and palpitations. Negative for leg swelling.  Gastrointestinal: Negative for abdominal pain, nausea and vomiting.  Genitourinary: Negative for flank pain.  Musculoskeletal: Negative for back pain.  Skin: Negative for pallor and wound.  Neurological: Negative for light-headedness and headaches.  All other systems reviewed and are negative.   Physical Exam Updated Vital Signs BP 110/78    Pulse 96    Temp 97.8 F (36.6 C) (Oral)    Resp 16    Ht 5\' 5"  (1.651 m)    Wt 85.7 kg    SpO2 100%    BMI 31.44 kg/m   Physical Exam Vitals and nursing note reviewed.  Constitutional:      Appearance: She is ill-appearing.  HENT:     Head: Normocephalic and atraumatic.     Nose: Nose normal. No congestion.       Mouth/Throat:     Mouth: Mucous membranes are moist.  Eyes:     Pupils: Pupils are equal, round, and reactive to light.  Cardiovascular:     Rate and Rhythm: Tachycardia present.     Comments: Rate in the 100s. Pulmonary:     Effort: Pulmonary effort is normal.  Abdominal:     General: Abdomen is flat.     Tenderness: There is no abdominal tenderness. There is no right CVA tenderness or left CVA tenderness.  Musculoskeletal:        General: Normal range of motion.     Cervical back: Normal range of motion and neck supple. No tenderness.  Skin:    General: Skin is warm and dry.  Neurological:     Mental Status: She is alert and oriented to person, place, and time.     ED Results / Procedures / Treatments   Labs (all labs ordered are listed, but only abnormal results are displayed) Labs Reviewed  CBC WITH DIFFERENTIAL/PLATELET - Abnormal; Notable for the following components:      Result Value   Hemoglobin 11.8 (*)    MCH 25.7 (*)    All other components within normal limits  COMPREHENSIVE METABOLIC PANEL - Abnormal; Notable for the following components:   Potassium 3.4 (*)    Glucose, Bld 137 (*)    All other components within normal limits  TROPONIN I (HIGH SENSITIVITY) - Abnormal; Notable for the following components:   Troponin I (High Sensitivity) 21 (*)    All other components within normal limits  MAGNESIUM  TSH    EKG EKG Interpretation  Date/Time:  Tuesday January 18 2020 15:13:17 EDT Ventricular Rate:  102 PR Interval:    QRS Duration: 84 QT Interval:  355 QTC Calculation: 463 R Axis:   57 Text Interpretation: Sinus tachycardia NO prior ECG for comparison. Possible delta wave. No STEMI Confirmed by [id number] (Theda Belfast) on 01/18/2020 3:16:09 PM   Radiology DG Chest Portable 1 View  Result Date: 01/18/2020 CLINICAL DATA:  Left-sided chest pain EXAM: PORTABLE CHEST 1 VIEW COMPARISON:  None. FINDINGS: The heart size and mediastinal contours are within  normal limits. Both lungs are clear. The visualized skeletal structures are unremarkable. IMPRESSION: No active disease. Electronically Signed   By: 01/20/2020 M.D.   On: 01/18/2020 17:45    Procedures Procedures (including critical care time)  Medications Ordered in ED Medications  metoprolol succinate (TOPROL-XL) 24 hr tablet 12.5 mg (12.5 mg Oral Given  01/18/20 1839)  potassium chloride SA (KLOR-CON) CR tablet 40 mEq (40 mEq Oral Given 01/18/20 1838)    ED Course  I have reviewed the triage vital signs and the nursing notes.  Pertinent labs & imaging results that were available during my care of the patient were reviewed by me and considered in my medical decision making (see chart for details).  Clinical Course as of Jan 17 1857  Tue Jan 18, 2020  1712 Troponin I (High Sensitivity)(!): 21 [JS]    Clinical Course User Index [JS] Claude Manges, PA-C   MDM Rules/Calculators/A&P  Patient with no PMH presents to the ED with a chief complaint of palpitations, this occurred suddenly while cooking dinner. This is the 7th time this has occurred, no prior cardiology follow-up, no prior history of CAD, no family history of CAD however to keep into account that patient does not have a PCP, has not had any previous medical work-up.  Patient's heart rate was found to be in the 180s when EMS arrived, she received adenosine, after failing 2 rounds of vagal maneuvers, she also received 500 mg bolus and converted to normal sinus rhythm.  She is alert and oriented x4, endorses symptoms such as shortness of breath, dizziness, chest pain have now resolved. Overall evaluation patient is overall fatigued, she remains on supplemental oxygen for comfort 2 L.  Neurologically intact, no pain with palpation across the chest wall, no radiation to her back, no abdominal pain, nausea, vomiting.  Pulses are equal and symmetric throughout all extremities.  EKG is remarkable for sinus rhythm after conversion,  however questionable delta wave has been noted, consult to cardiology has been placed for further recommendations.  A TSH level has also been added as this is patient's seventh episode, interpretation of her labs revealed a CBC without any leukocytosis, slight decrease in hemoglobin but prior history of anemia.  CMP with some mild hypokalemia, creatinine levels within normal limits.  LFTs are unremarkable.  Magnesium levels normal.  First troponin 21, she is pending a cardiology consult. The results of testing have been discussed with patient. She remains hemodynamically stable. Cardiology in the department reviewing EKG. Patient and husband have been canceled on remaining here for further cardiology evaluation.  X-ray of her chest without any consultation, no pneumothorax or pleural effusion.   Patient was evaluated by cardiology at the bedside, her EKG was reviewed, some suspicion for WPW, she will be scheduled with the EP lab.  She was provided with 1 dose of metoprolol XL 24-hour prior to discharge from the ED.  She remains in stable condition.  Patient will follow-up for further management with the EP lab.  Last vital signs remarkable for pressure within normal limits, slight ovation heart rate and 96, no hypoxia.  Patient stable for discharge.   Portions of this note were generated with Scientist, clinical (histocompatibility and immunogenetics). Dictation errors may occur despite best attempts at proofreading.  Final Clinical Impression(s) / ED Diagnoses Final diagnoses:  SVT (supraventricular tachycardia) (HCC)    Rx / DC Orders ED Discharge Orders         Ordered    metoprolol succinate (TOPROL-XL) 25 MG 24 hr tablet   Once     Discontinue  Reprint     01/18/20 1803           Claude Manges, PA-C 01/18/20 1858    Tegeler, Canary Brim, MD 01/18/20 [phone]

## 2020-01-18 NOTE — ED Triage Notes (Signed)
Pt bib gems from home. Called out for SOB. HR 180, RR25, 2 rounds of vagal maneuvers with no change. NS, 6mg  adenosine, short run of VT and then converted to NSR. Pressures have been good. Pt has been a/o x4  18LAC

## 2020-01-18 NOTE — ED Notes (Signed)
Pt requesting to go home. Saying she feels better now and is ready to go. Pt started taking off her EKG stickers. Pt informed we have to keep monitoring her heart while she is here. Pt understands. Pt walked to bathroom. This RN said MD will be notified of her wanting to leave.

## 2020-02-01 ENCOUNTER — Ambulatory Visit (HOSPITAL_COMMUNITY): Payer: BC Managed Care – PPO | Attending: Cardiology

## 2020-02-01 ENCOUNTER — Other Ambulatory Visit: Payer: Self-pay

## 2020-02-01 DIAGNOSIS — R079 Chest pain, unspecified: Secondary | ICD-10-CM | POA: Insufficient documentation

## 2020-02-01 DIAGNOSIS — I471 Supraventricular tachycardia: Secondary | ICD-10-CM

## 2020-02-01 LAB — ECHOCARDIOGRAM COMPLETE
Area-P 1/2: 4.31 cm2
S' Lateral: 2.7 cm

## 2020-02-02 ENCOUNTER — Telehealth: Payer: Self-pay

## 2020-02-02 NOTE — Telephone Encounter (Signed)
Call attempted. No answer, no vm. 

## 2020-02-02 NOTE — Telephone Encounter (Signed)
-----   Message from Sondra Barges, PA-C sent at 02/01/2020  4:28 PM EDT ----- Echo showed normal pump function, normal wall motion, normal relaxation of the heart, normal pressure in the right side of the heart, and a trivially leaky mitral valve.  Overall, reassuring echo.  She should follow-up with EP as previously scheduled next month.

## 2020-02-07 NOTE — Telephone Encounter (Signed)
Attempted to call patient. LMTCB 02/07/2020   

## 2020-02-15 NOTE — Telephone Encounter (Signed)
Call to patient to review labs.    Spoke to husband per FYI. He verbalized understanding and has no further questions at this time.    Advised pt to call for any further questions or concerns.  No further orders.  Confirmed upcoming appt with Dr. Elberta Fortis.

## 2020-03-02 DIAGNOSIS — R231 Pallor: Secondary | ICD-10-CM | POA: Diagnosis not present

## 2020-03-02 DIAGNOSIS — R0789 Other chest pain: Secondary | ICD-10-CM | POA: Diagnosis not present

## 2020-03-02 DIAGNOSIS — Z209 Contact with and (suspected) exposure to unspecified communicable disease: Secondary | ICD-10-CM | POA: Diagnosis not present

## 2020-03-02 DIAGNOSIS — R079 Chest pain, unspecified: Secondary | ICD-10-CM | POA: Diagnosis not present

## 2020-03-07 ENCOUNTER — Encounter: Payer: Self-pay | Admitting: Cardiology

## 2020-03-07 ENCOUNTER — Other Ambulatory Visit: Payer: Self-pay

## 2020-03-07 ENCOUNTER — Ambulatory Visit (INDEPENDENT_AMBULATORY_CARE_PROVIDER_SITE_OTHER): Payer: BC Managed Care – PPO | Admitting: Cardiology

## 2020-03-07 VITALS — BP 138/74 | HR 83 | Ht 65.0 in | Wt 186.4 lb

## 2020-03-07 DIAGNOSIS — I471 Supraventricular tachycardia: Secondary | ICD-10-CM

## 2020-03-07 MED ORDER — METOPROLOL SUCCINATE ER 25 MG PO TB24
25.0000 mg | ORAL_TABLET | Freq: Every day | ORAL | 3 refills | Status: DC
Start: 2020-03-07 — End: 2020-06-15

## 2020-03-07 NOTE — Patient Instructions (Addendum)
Medication Instructions:  Your physician has recommended you make the following change in your medication:  1. INCREASE Toprol to 25 mg once daily  *If you need a refill on your cardiac medications before your next appointment, please call your pharmacy*   Lab Work: None ordered   Testing/Procedures: None ordered   Follow-Up: At [facility], you and your health needs are our priority.  As part of our continuing mission to provide you with exceptional heart care, we have created designated Provider Care Teams.  These Care Teams include your primary Cardiologist (physician) and Advanced Practice Providers (APPs -  Physician Assistants and Nurse Practitioners) who all work together to provide you with the care you need, when you need it.  We recommend signing up for the patient portal called "MyChart".  Sign up information is provided on this After Visit Summary.  MyChart is used to connect with patients for Virtual Visits (Telemedicine).  Patients are able to view lab/test results, encounter notes, upcoming appointments, etc.  Non-urgent messages can be sent to your provider as well.   To learn more about what you can do with MyChart, go to [email or web address].    Your next appointment:   3 month(s)  The format for your next appointment:   In Person  Provider:   Loman Brooklyn, MD   Thank you for choosing [facility]!!   Dory Horn, RN [phone]    Other Instructions   Supraventricular Tachycardia, Adult Supraventricular tachycardia (SVT) is a kind of abnormal heartbeat. It makes your heart beat very fast and then beat at a normal speed. A normal resting heartbeat is 60-100 times a minute. This condition can make your heart beat more than 150 times a minute. Times of having a fast heartbeat (episodes) can be scary, but they are usually not dangerous. In some cases, they may lead to heart failure if:  They happen many times per day.  Last longer than a few  seconds. What are the causes?  A normal heartbeat starts when an area called the sinoatrial node sends out an electrical signal. In SVT, other areas of the heart send out signals that get in the way of the signal from the sinoatrial node. What increases the risk? You are more likely to develop this condition if you are:  31-62 years old.  A woman. The following factors may make you more likely to develop this condition:  Stress.  Tiredness.  Smoking.  Stimulant drugs, such as cocaine and methamphetamine.  Alcohol.  Caffeine.  Pregnancy.  Feeling worried or nervous (anxiety). What are the signs or symptoms?  A pounding heart.  A feeling that your heart is skipping beats (palpitations).  Weakness.  Trouble getting enough air.  Pain or tightness in your chest.  Feeling like you are going to pass out (faint).  Feeling worried or nervous.  Dizziness.  Sweating.  Feeling sick to your stomach (nausea).  Passing out.  Tiredness. Sometimes, there are no symptoms. How is this treated?  Vagal nerve stimulation. Ways to do this include: ? Holding your breath and pushing, as though you are pooping (having a bowel movement). ? Massaging an area on one side of your neck. Do not try this yourself. Only a doctor should do this. If done the wrong way, it can lead to a stroke. ? Bending forward with your head between your legs. ? Coughing while bending forward with your head between your legs. ? Closing your eyes and massaging your eyeballs. Ask a  doctor how to do this.  Medicines that prevent attacks.  Medicine to stop an attack given through an IV tube at the hospital.  A small electric shock (cardioversion) that stops an attack.  Radiofrequency ablation. In this procedure, a small, thin tube (catheter) is used to send energy to the area that is causing the rapid heartbeats. If you do not have symptoms, you may not need treatment. Follow these instructions at  home: Stress  Avoid things that make you feel stressed.  To deal with stress, try: ? Doing yoga or meditation, or being out in nature. ? Listening to relaxing music. ? Doing deep breathing. ? Taking steps to be healthy, such as getting lots of sleep, exercising, and eating a balanced diet. ? Talking with a mental health doctor. Lifestyle   Try to get at least 7 hours of sleep each night.  Do not use any products that contain nicotine or tobacco, such as cigarettes, e-cigarettes, and chewing tobacco. If you need help quitting, ask your doctor.  Be aware of how alcohol affects you. ? If alcohol gives you a fast heartbeat, do not drink alcohol. ? If alcohol does not seem to give you a fast heartbeat, limit alcohol use to no more than 1 drink a day for women who are not pregnant, and 2 drinks a day for men. In the [place] one drink is one of these:  12 oz of beer (355 mL).  5 oz of wine (148 mL).  1 oz of hard liquor (44 mL).  Be aware of how caffeine affects you. ? If caffeine gives you a fast heartbeat, do not eat, drink, or use anything with caffeine in it. ? If caffeine does not seem to give you a fast heartbeat, limit how much caffeine you eat, drink, or use.  Do not use stimulant drugs. If you need help quitting, ask your doctor. General instructions  Stay at a healthy weight.  Exercise regularly. Ask your doctor about good activities for you. Try one or a mixture of these: ? 150 minutes a week of gentle exercise, like walking or yoga. ? 75 minutes a week of exercise that is very active, like running or swimming.  Do vagus nerve treatments to slow down your heartbeat as told by your doctor.  Take over-the-counter and prescription medicines only as told by your doctor.  Keep all follow-up visits as told by your doctor. This is important. Contact a doctor if:  You have a fast heartbeat more often.  Times of having a fast heartbeat last longer than before.  Home  treatments to slow down your heartbeat do not help.  You have new symptoms. Get help right away if:  You have chest pain.  Your symptoms get worse.  You have trouble breathing.  Your heart beats very fast for more than 20 minutes.  You pass out. These symptoms may be an emergency. Do not wait to see if the symptoms will go away. Get medical help right away. Call your local emergency services (911 in the [place] Do not drive yourself to the hospital. Summary  SVT is a type of abnormal heart beat.  This condition can make your heart beat more than 150 times a minute.  Treatment depends on how often the condition happens and your symptoms. This information is not intended to replace advice given to you by your health care provider. Make sure you discuss any questions you have with your health care provider. Document Revised: 05/05/2018 Document Reviewed:  05/05/2018 Elsevier Patient Education  2020 [employer]    Cardiac Ablation  Cardiac ablation is a procedure to stop some heart tissue from causing problems. The heart has many electrical connections. Sometimes these connections make the heart beat very fast or irregularly. Removing some problem areas can improve the heart rhythm or make it normal. What happens before the procedure?  Follow instructions from your doctor about what you cannot eat or drink.  Ask your doctor about: ? Changing or stopping your normal medicines. This is important if you take diabetes medicines or blood thinners. ? Taking medicines such as aspirin and ibuprofen. These medicines can thin your blood. Do not take these medicines before your procedure if your doctor tells you not to.  Plan to have someone take you home.  If you will be going home right after the procedure, plan to have someone with you for 24 hours. What happens during the procedure?  To lower your risk of infection: ? Your health care team will wash or sanitize their hands. ? Your  skin will be washed with soap. ? Hair may be removed from your neck or groin.  An IV tube will be put into one of your veins.  You will be given a medicine to help you relax (sedative).  Skin on your neck or groin will be numbed.  A cut (incision) will be made in your neck or groin.  A needle will be put through your cut and into a vein in your neck or groin.  A tube (catheter) will be put into the needle. The tube will be moved to your heart. X-rays (fluoroscopy) will be used to help guide the tube.  Small devices (electrodes) on the tip of the tube will send out electrical currents.  Dye may be put through the tube. This helps your surgeon see your heart.  Electrical energy will be used to scar (ablate) some heart tissue. Your surgeon may use: ? Heat (radiofrequency energy). ? Laser energy. ? Extreme cold (cryoablation).  The tube will be taken out.  Pressure will be held on your cut. This helps stop bleeding.  A bandage (dressing) will be put on your cut. The procedure may vary. What happens after the procedure?  You will be monitored until your medicines have worn off.  Your cut will be watched for bleeding. You will need to lie still for a few hours.  Do not drive for 24 hours or as long as your doctor tells you. Summary  Cardiac ablation is a procedure to stop some heart tissue from causing problems.  Electrical energy will be used to scar (ablate) some heart tissue. This information is not intended to replace advice given to you by your health care provider. Make sure you discuss any questions you have with your health care provider. Document Revised: 05/30/2017 Document Reviewed: 05/06/2016 Elsevier Patient Education  2020 [employer].

## 2020-03-07 NOTE — Progress Notes (Signed)
Electrophysiology Office Note   Date:  03/07/2020   ID:  Ebony, [name] Jun 09, 1981, MRN [id number]  PCP:  Myles Lipps, MD  Cardiologist:   Primary Electrophysiologist:  Mikhaela Zaugg Jorja Loa, MD    Chief Complaint: SVT   History of Present Illness: Ebony [name] is a 39 y.o. female who is being seen today for the evaluation of SVT at the request of Myles Lipps, MD. Presenting today for electrophysiology evaluation.  She has a history of hypertension immune thrombocytopenia during pregnancy, and SVT.  Her symptoms of palpitations go back 4 to 5 years and occur multiple times per year.  Her symptoms occur spontaneously, though caffeine may be a trigger as well.  She has chest pain and dyspnea with her palpitations.  She has never required adenosine but she has called EMS multiple times.  Her palpitations typically resolve with oxygen therapy.  July 2021, she developed sudden onset of palpitations.  She also had left-sided chest pain and dyspnea.  She called EMS and on their arrival, her heart rate was 180 bpm.  Vagal maneuvers were attempted x2 and were unsuccessful.  She was given 6 mg of adenosine.  She subsequently converted to sinus rhythm.  All symptoms resolved after conversion.  Today, she denies symptoms of palpitations, chest pain, shortness of breath, orthopnea, PND, lower extremity edema, claudication, dizziness, presyncope, syncope, bleeding, or neurologic sequela. The patient is tolerating medications without difficulties.  Since May she has had no further palpitations.  She has noted left arm pain.  She called EMS at the time and was found to be in normal rhythm.  Aside from that she has done well.  She was put on a very small dose of Toprol-XL in May.   Past Medical History:  Diagnosis Date  . Anemia    history  . Back pain   . Headache(784.0)    otc med prn  . Hypertension    gestational  . 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 Neuromuscular disorder (HCC)   . PONV (postoperative nausea and vomiting)   . PSVT (paroxysmal supraventricular tachycardia) (HCC)    Past Surgical History:  Procedure Laterality Date  . CESAREAN SECTION  12/2008  . CESAREAN SECTION N/A 08/24/2012   Procedure: CESAREAN SECTION;  Surgeon: Mickel Baas, MD;  Location: [facility];  Service: Obstetrics;  Laterality: N/A;  . CESAREAN SECTION N/A 07/29/2014   Procedure: CESAREAN SECTION;  Surgeon: Essie Hart, MD;  Location: [facility];  Service: Obstetrics;  Laterality: N/A;  . CESAREAN SECTION    . WISDOM TOOTH EXTRACTION       Current Outpatient Medications  Medication Sig Dispense Refill  . acetaminophen (TYLENOL) 325 MG tablet Take 325-650 mg by mouth every 6 (six) hours as needed for moderate pain.    . metoprolol succinate (TOPROL-XL) 25 MG 24 hr tablet Take 0.5 tablets (12.5 mg total) by mouth once for 1 dose. 30 tablet 3   No current facility-administered medications for this visit.    Allergies:   Patient has no known allergies.   Social History:  The patient  reports that she has never smoked. She has never used smokeless tobacco. She reports that she does not drink alcohol and does not use drugs.   Family History:  The patient's family history includes Stroke in her mother.    ROS:  Please see the history of present illness.   Otherwise, review of systems is positive for none.  All other systems are reviewed and negative.    PHYSICAL EXAM: VS:  There were no vitals taken for this visit. , BMI There is no height or weight on file to calculate BMI. GEN: Well nourished, well developed, in no acute distress  HEENT: normal  Neck: no JVD, carotid bruits, or masses Cardiac: RRR; no murmurs, rubs, or gallops,no edema  Respiratory:  clear to auscultation bilaterally, normal work of breathing GI: soft, nontender, nondistended, + BS MS: no deformity or atrophy  Skin: warm and dry Neuro:  Strength and sensation are  intact Psych: euthymic mood, full affect  EKG:  EKG is not ordered today. Personal review of the ekg ordered 01/19/20 shows sinus rhythm, rate 102  Recent Labs: 01/18/2020: ALT 25; BUN 13; Creatinine, Ser 0.63; Hemoglobin 11.8; Magnesium 1.8; Platelets PLATELET CLUMPS NOTED ON SMEAR, UNABLE TO ESTIMATE; Potassium 3.4; Sodium 139; TSH 1.102    Lipid Panel     Component Value Date/Time   CHOL 180 06/29/2019 1402   TRIG 60 06/29/2019 1402   HDL 56 06/29/2019 1402   CHOLHDL 3.2 06/29/2019 1402   LDLCALC 112 (H) 06/29/2019 1402     Wt Readings from Last 3 Encounters:  01/18/20 188 lb 15 oz (85.7 kg)  06/29/19 189 lb (85.7 kg)  05/26/18 184 lb 3.2 oz (83.6 kg)      Other studies Reviewed: Additional studies/ records that were reviewed today include: EMS run sheet ECG showing SVT personally reviewed Review of the above records today demonstrates:    TTE 02/01/20 1. Left ventricular ejection fraction, by estimation, is 60 to 65%. The  left ventricle has normal function. The left ventricle has no regional  wall motion abnormalities. Left ventricular diastolic parameters were  normal.  2. Right ventricular systolic function is normal. The right ventricular  size is normal. There is normal pulmonary artery systolic pressure.  3. The mitral valve is normal in structure. Trivial mitral valve  regurgitation. No evidence of mitral stenosis.  4. The aortic valve is normal in structure. Aortic valve regurgitation is  not visualized. No aortic stenosis is present.  5. The inferior vena cava is normal in size with greater than 50%  respiratory variability, suggesting right atrial pressure of 3 mmHg.    ASSESSMENT AND PLAN:  1.  SVT: Patient has had multiple episodes of tachypalpitations.  The most recent has responded to adenosine.  Echo without abnormality.  She has had multiple episodes of SVT, likely AVNRT.  This would be amenable to ablation, but she would like to hold off for now.   She is on 12.5 mg of Toprol-XL.  I have asked her to take 25 mg to see if this Dorna Mallet help improve her overall symptoms.  We have given her information on both SVT and ablation.  If she continues to have symptoms, would increase her Toprol-XL to 50 mg or switch her to diltiazem.  She may also consider ablation in the future.    Current medicines are reviewed at length with the patient today.   The patient does not have concerns regarding her medicines.  The following changes were made today: Increase Toprol-XL  Labs/ tests ordered today include:  No orders of the defined types were placed in this encounter.    Disposition:   FU with Adelisa Satterwhite 3 months  Signed, Kayleb Warshaw Jorja Loa, MD  03/07/2020 8:20 AM     [facility] [address] [place] [address] [phone] (office) [phone] (fax)

## 2020-03-19 ENCOUNTER — Emergency Department (HOSPITAL_COMMUNITY): Payer: BC Managed Care – PPO

## 2020-03-19 ENCOUNTER — Emergency Department (HOSPITAL_COMMUNITY)
Admission: EM | Admit: 2020-03-19 | Discharge: 2020-03-19 | Disposition: A | Discharge status: Home / Self Care | Payer: BC Managed Care – PPO | Attending: Emergency Medicine | Admitting: Emergency Medicine

## 2020-03-19 ENCOUNTER — Encounter (HOSPITAL_COMMUNITY): Payer: Self-pay

## 2020-03-19 ENCOUNTER — Other Ambulatory Visit: Payer: Self-pay

## 2020-03-19 DIAGNOSIS — Z20822 Contact with and (suspected) exposure to covid-19: Secondary | ICD-10-CM | POA: Insufficient documentation

## 2020-03-19 DIAGNOSIS — I10 Essential (primary) hypertension: Secondary | ICD-10-CM | POA: Insufficient documentation

## 2020-03-19 DIAGNOSIS — R079 Chest pain, unspecified: Secondary | ICD-10-CM | POA: Diagnosis not present

## 2020-03-19 DIAGNOSIS — R531 Weakness: Secondary | ICD-10-CM | POA: Insufficient documentation

## 2020-03-19 DIAGNOSIS — M549 Dorsalgia, unspecified: Secondary | ICD-10-CM | POA: Diagnosis not present

## 2020-03-19 DIAGNOSIS — Z79899 Other long term (current) drug therapy: Secondary | ICD-10-CM | POA: Diagnosis not present

## 2020-03-19 DIAGNOSIS — R05 Cough: Secondary | ICD-10-CM | POA: Diagnosis not present

## 2020-03-19 DIAGNOSIS — R0789 Other chest pain: Secondary | ICD-10-CM

## 2020-03-19 DIAGNOSIS — R29898 Other symptoms and signs involving the musculoskeletal system: Secondary | ICD-10-CM | POA: Diagnosis not present

## 2020-03-19 DIAGNOSIS — R0981 Nasal congestion: Secondary | ICD-10-CM | POA: Insufficient documentation

## 2020-03-19 DIAGNOSIS — R0602 Shortness of breath: Secondary | ICD-10-CM | POA: Diagnosis not present

## 2020-03-19 DIAGNOSIS — R52 Pain, unspecified: Secondary | ICD-10-CM | POA: Diagnosis not present

## 2020-03-19 LAB — CBC
HCT: 39.1 % (ref 36.0–46.0)
Hemoglobin: 12 g/dL (ref 12.0–15.0)
MCH: 26.4 pg (ref 26.0–34.0)
MCHC: 30.7 g/dL (ref 30.0–36.0)
MCV: 85.9 fL (ref 80.0–100.0)
Platelets: 138 10*3/uL — ABNORMAL LOW (ref 150–400)
RBC: 4.55 MIL/uL (ref 3.87–5.11)
RDW: 14.6 % (ref 11.5–15.5)
WBC: 8.7 10*3/uL (ref 4.0–10.5)
nRBC: 0 % (ref 0.0–0.2)

## 2020-03-19 LAB — BASIC METABOLIC PANEL
Anion gap: 8 (ref 5–15)
BUN: 11 mg/dL (ref 6–20)
CO2: 24 mmol/L (ref 22–32)
Calcium: 9.1 mg/dL (ref 8.9–10.3)
Chloride: 105 mmol/L (ref 98–111)
Creatinine, Ser: 0.52 mg/dL (ref 0.44–1.00)
GFR calc Af Amer: >60 mL/min (ref >60)
GFR calc non Af Amer: >60 mL/min (ref >60)
Glucose, Bld: 103 mg/dL — ABNORMAL HIGH (ref 70–99)
Potassium: 3.8 mmol/L (ref 3.5–5.1)
Sodium: 137 mmol/L (ref 135–145)

## 2020-03-19 LAB — SARS CORONAVIRUS 2 BY RT PCR (HOSPITAL ORDER, PERFORMED IN ~~LOC~~ HOSPITAL LAB): SARS Coronavirus 2: NEGATIVE

## 2020-03-19 LAB — TROPONIN I (HIGH SENSITIVITY)
Troponin I (High Sensitivity): <2 ng/L (ref <18)
Troponin I (High Sensitivity): <2 ng/L (ref <18)

## 2020-03-19 LAB — I-STAT BETA HCG BLOOD, ED (MC, WL, AP ONLY): I-stat hCG, quantitative: <5 m[IU]/mL (ref <5)

## 2020-03-19 MED ORDER — SODIUM CHLORIDE 0.9 % IV BOLUS
500.0000 mL | Freq: Once | INTRAVENOUS | Status: AC
  Administered 2020-03-19: 500 mL via INTRAVENOUS

## 2020-03-19 NOTE — Discharge Instructions (Addendum)
Please call your cardiologist Monday morning.  Your blood pressure was normal today and your heart rate is low.  It may be that with the medications your heart rate is dropping low.   If your chest pain changes, you develop worsening shortness of breath, fevers, or any new or concerning symptoms please seek additional medical care and evaluation.

## 2020-03-19 NOTE — ED Provider Notes (Signed)
[facility] Provider Note   CSN: [id number] Arrival date & time: 03/19/20  1054     History No chief complaint on file.   Ebony [name] is a 39 y.o. 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 She reports that today she was at work when she had pain in her chest.  She states that whenever she has pain in her chest she has weakness in her left arm and leg.  She tells me this has been going on for over 10 years and worsens when her chest hurts.  She states that she did not take her metoprolol for the past 2 days.  She feels like when she is at work this is "too much" She states that her chest began hurting without feelings of palpitation at about 8 or 9 this morning.  This is improved and she is back to her chronic level of chest pain and shortness of breath.  She reports she has had shortness of breath over the past 10 years.  She denies any recent fevers.  She is not vaccinated against Covid, she reports that her son did have a possible exposure however his test was negative.  About a week ago she was having cough and nasal congestion however did not get tested.  She reports that she calls EMS about once a week for chest pain.   She denies excess caffeine use, says has only had one cup of coffee today.   I offered patient a professional interpreter however she refused stating she did not need one.    She was seen by Dr. Elberta Fortisamnitz with cardiology on 9/7, at that point her metoprolol XL was increased from 12.5 to 25 mg.  She had an echo without abnormality.   She denies a history of PE/DVT.  No leg swelling.  No hormone use.    HPI     Past Medical History:  Diagnosis Date  . Anemia    history  . Back pain   . Headache(784.0)    otc med prn  . Hypertension    gestational  . 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  Neuromuscular disorder (HCC)   . PONV (postoperative nausea and vomiting)   . PSVT (paroxysmal supraventricular tachycardia) (HCC)     There are no problems to display for this patient.   Past Surgical History:  Procedure Laterality Date  . CESAREAN SECTION  12/2008  . CESAREAN SECTION N/A 08/24/2012   Procedure: CESAREAN SECTION;  Surgeon: Mickel Baasichard D Kaplan, MD;  Location: [facility];  Service: Obstetrics;  Laterality: N/A;  . CESAREAN SECTION N/A 07/29/2014   Procedure: CESAREAN SECTION;  Surgeon: Essie HartWalda Pinn, MD;  Location: [facility];  Service: Obstetrics;  Laterality: N/A;  . CESAREAN SECTION    . WISDOM TOOTH EXTRACTION       OB History    Gravida  7   Para  6   Term  6   Preterm  0   AB  0   Living  2     SAB  0   TAB  0   Ectopic  0   Multiple      Live Births  2           Family History  Problem Relation Age of Onset  . Stroke Mother     Social History   Tobacco Use  . Smoking  status: Never Smoker  . Smokeless tobacco: Never Used  Substance Use Topics  . Alcohol use: No  . Drug use: No    Home Medications Prior to Admission medications   Medication Sig Start Date End Date Taking? Authorizing Provider  acetaminophen (TYLENOL) 325 MG tablet Take 325-650 mg by mouth every 6 (six) hours as needed for moderate pain.    [provider]  metoprolol succinate (TOPROL XL) 25 MG 24 hr tablet Take 1 tablet (25 mg total) by mouth daily. 03/07/20   Camnitz, Andree Coss, MD    Allergies    Patient has no known allergies.  Review of Systems   Review of Systems  Constitutional: Positive for chills and fatigue. Negative for fever.  HENT: Positive for congestion.   Respiratory: Positive for cough, chest tightness and shortness of breath.   Cardiovascular: Positive for chest pain. Negative for palpitations and leg swelling.  Gastrointestinal: Negative for abdominal pain, diarrhea, nausea and vomiting.  Genitourinary: Negative for dysuria.    Musculoskeletal: Positive for back pain (Chronic ). Negative for neck pain.  Skin: Negative for color change and rash.  Neurological: Positive for weakness (Left arm and leg (chronic, over 10 years per patient)).  Psychiatric/Behavioral: Negative for confusion.  All other systems reviewed and are negative.    Physical Exam Updated Vital Signs BP 108/64   Pulse 65   Temp 98.3 F (36.8 C) (Oral)   Resp 17   Ht 5\' 6"  (1.676 m)   Wt 84.4 kg   LMP 03/05/2020 (Exact Date)   SpO2 99%   BMI 30.02 kg/m   Physical Exam Vitals and nursing note reviewed.  Constitutional:      General: She is not in acute distress.    Appearance: She is well-developed. She is not diaphoretic.  HENT:     Head: Normocephalic and atraumatic.  Eyes:     General: No scleral icterus.       Right eye: No discharge.        Left eye: No discharge.     Conjunctiva/sclera: Conjunctivae normal.  Cardiovascular:     Rate and Rhythm: Normal rate and regular rhythm.     Pulses: Normal pulses.     Heart sounds: Normal heart sounds.  Pulmonary:     Effort: Pulmonary effort is normal. No respiratory distress.     Breath sounds: Normal breath sounds. No stridor.  Abdominal:     General: There is no distension.     Tenderness: There is no abdominal tenderness. There is no guarding.  Musculoskeletal:        General: No deformity.     Cervical back: Normal range of motion. No rigidity.     Right lower leg: No edema.     Left lower leg: No edema.  Skin:    General: Skin is warm and dry.  Neurological:     Mental Status: She is alert.     Motor: No abnormal muscle tone.     Comments: Patient is awake and alert, answers questions appropriately.  Speech is not slurred.  Facial movements are symmetric.  No pronator drift.  4/5 strength diffusely in left arm and leg, 5/5 strength in the right arm and leg.  Patient does report pain with strength testing on the left side.  Psychiatric:        Mood and Affect: Mood  normal.        Behavior: Behavior normal.     ED Results / Procedures / Treatments  Labs (all labs ordered are listed, but only abnormal results are displayed) Labs Reviewed  BASIC METABOLIC PANEL - Abnormal; Notable for the following components:      Result Value   Glucose, Bld 103 (*)    All other components within normal limits  CBC - Abnormal; Notable for the following components:   Platelets 138 (*)    All other components within normal limits  SARS CORONAVIRUS 2 BY RT PCR (HOSPITAL ORDER, PERFORMED IN [facility])  I-STAT BETA HCG BLOOD, ED ([facility], [facility], [facility] ONLY)  TROPONIN I (HIGH SENSITIVITY)  TROPONIN I (HIGH SENSITIVITY)    EKG EKG Interpretation  Date/Time:  Sunday March 19 2020 11:44:16 EDT Ventricular Rate:  67 PR Interval:  134 QRS Duration: 70 QT Interval:  392 QTC Calculation: 414 R Axis:   43 Text Interpretation: Normal sinus rhythm no acute ST/T changes Confirmed by Pricilla Loveless [phone]) on 03/19/2020 4:38:47 PM   Radiology DG Chest 2 View  Result Date: 03/19/2020 CLINICAL DATA:  Chest pain EXAM: CHEST - 2 VIEW COMPARISON:  01/18/2020 FINDINGS: The heart size and mediastinal contours are within normal limits. Both lungs are clear. The visualized skeletal structures are unremarkable. IMPRESSION: No acute abnormality of the lungs. Electronically Signed   By: Lauralyn Primes M.D.   On: 03/19/2020 12:15   CT Head Wo Contrast  Result Date: 03/19/2020 CLINICAL DATA:  Stroke suspected, left arm and leg weakness for over 1 week EXAM: CT HEAD WITHOUT CONTRAST TECHNIQUE: Contiguous axial images were obtained from the base of the skull through the vertex without intravenous contrast. COMPARISON:  None. FINDINGS: Brain: No evidence of acute infarction, hemorrhage, hydrocephalus, extra-axial collection or mass lesion/mass effect. Vascular: No hyperdense vessel or unexpected calcification. Skull: Normal. Negative for fracture or focal lesion. Sinuses/Orbits: No  acute finding. Other: None. IMPRESSION: No acute intracranial pathology. No non-contrast CT findings to explain left-sided weakness. Consider MRI to more sensitively evaluate for acute diffusion restricting infarction if suspected. Electronically Signed   By: Lauralyn Primes M.D.   On: 03/19/2020 17:59    Procedures Procedures (including critical care time)  Medications Ordered in ED Medications  sodium chloride 0.9 % bolus 500 mL (0 mLs Intravenous Stopped 03/19/20 1913)    ED Course  I have reviewed the triage vital signs and the nursing notes.  Pertinent labs & imaging results that were available during my care of the patient were reviewed by me and considered in my medical decision making (see chart for details).  Clinical Course as of Mar 19 1914  Wynelle Link Mar 19, 2020  1910 I went to reevaluate patient.  She states she is feeling better.  She is requesting a work note to give her 3 months off.  I told her that there is no medical indication at this point for me to give her a 40-month work note.  When I told her that this would most likely require FMLA for a paperwork that we do not complete in the emergency room she then asked me for 3 weeks to 1 month off.  I recommended that she speak with her primary care doctor or cardiologist and take her medications.    [EH]    Clinical Course User Index [EH] Norman Clay   MDM Rules/Calculators/A&P                         Patient is a 39 year old woman who presents today for evaluation of multiple  complaints.  Her primary concerns today are shortness of breath for over 10 years with chest pain and tightness.  She has multiple other complaints including that when her chest hurts she develops weakness on her left arm and leg that she reports is been unchanged over 10 years.  Troponin x2 is not elevated.  EKG without evidence of tachycardia.  She states she has not taken her metoprolol in the past 2 days.  Here her heart rate is in the 60s and  her blood pressures are between 100-110 systolic, do not feel that she needs metoprolol at this time.  If this is how her blood pressures and heart rate are usually running then I am concerned the metoprolol may be too high of a dose for her and recommended she follow-up with her cardiologist.  Given her weakness CT head was obtained without evidence of ischemia or other acute abnormality.  Given that she reports the symptoms have been ongoing for 10 years and wax and wane with her chest pains do not suspect aneurysm or dissection.  She is PERC negative, doubt PE.  Chest x-ray without consolidation, pneumothorax, or other abnormalities.  CBC and BMP are unremarkable.  Pregnancy test is negative.  Covid test is negative. Doubt ACS or PE. Marland Kitchen  She does not have any pain in her neck, and given the 10-year history of this left-sided weakness with chest pain and shortness of breath I have a very low suspicion for any acutely life-threatening condition.  Suspect a large component may be related to pain rather than true weakness given that it has been, according to patient, waxing and waning with her chest pain over 10 years and leading me to suspect a possible musculoskeletal cause of her pain which is limiting her strength.   At the end of the visit patient asked me for a work note for 3 months off telling me "I need a break from work."  I referred her to her primary care doctor or cardiologist for this.  She then attempted to ask me for 3 to 4 weeks off.  I informed her that this would most likely require FMLA paperwork that we do not complete in the emergency room, and that I do not have any clear indication at this time that she medically requires this requested time off.  I did give her a work note for 3 days, which I suspect will be an appropriate amount of time for her to contact her primary care doctor and cardiologist as I instructed her to.  Return precautions were discussed with patient who states their  understanding.  At the time of discharge patient denied any unaddressed complaints or concerns.  Patient is agreeable for discharge home.  Note: Portions of this report may have been transcribed using voice recognition software. Every effort was made to ensure accuracy; however, inadvertent computerized transcription errors may be present  Final Clinical Impression(s) / ED Diagnoses Final diagnoses:  Atypical chest pain    Rx / DC Orders ED Discharge Orders    None       Norman Clay 03/19/20 Vonzell Schlatter, MD 03/22/20 [phone]

## 2020-03-19 NOTE — ED Triage Notes (Signed)
Patient arrived by [facility] after developing SOB and chest tightness while working this am. Patient has hx of SVT but none today. Patient alert and oriented, NAD

## 2020-04-20 ENCOUNTER — Ambulatory Visit: Payer: BC Managed Care – PPO | Admitting: Emergency Medicine

## 2020-04-21 ENCOUNTER — Encounter: Payer: Self-pay | Admitting: Emergency Medicine

## 2020-05-01 ENCOUNTER — Emergency Department (HOSPITAL_COMMUNITY): Payer: BC Managed Care – PPO

## 2020-05-01 ENCOUNTER — Emergency Department (HOSPITAL_COMMUNITY)
Admission: EM | Admit: 2020-05-01 | Discharge: 2020-05-02 | Disposition: A | Discharge status: Home / Self Care | Payer: BC Managed Care – PPO | Attending: Emergency Medicine | Admitting: Emergency Medicine

## 2020-05-01 DIAGNOSIS — U071 COVID-19: Secondary | ICD-10-CM | POA: Diagnosis not present

## 2020-05-01 DIAGNOSIS — Z79899 Other long term (current) drug therapy: Secondary | ICD-10-CM | POA: Diagnosis not present

## 2020-05-01 DIAGNOSIS — I10 Essential (primary) hypertension: Secondary | ICD-10-CM | POA: Insufficient documentation

## 2020-05-01 DIAGNOSIS — R0602 Shortness of breath: Secondary | ICD-10-CM | POA: Diagnosis not present

## 2020-05-01 LAB — BASIC METABOLIC PANEL
Anion gap: 8 (ref 5–15)
BUN: 7 mg/dL (ref 6–20)
CO2: 25 mmol/L (ref 22–32)
Calcium: 8.7 mg/dL — ABNORMAL LOW (ref 8.9–10.3)
Chloride: 101 mmol/L (ref 98–111)
Creatinine, Ser: 0.63 mg/dL (ref 0.44–1.00)
GFR, Estimated: >60 mL/min (ref >60)
Glucose, Bld: 124 mg/dL — ABNORMAL HIGH (ref 70–99)
Potassium: 3.7 mmol/L (ref 3.5–5.1)
Sodium: 134 mmol/L — ABNORMAL LOW (ref 135–145)

## 2020-05-01 LAB — CBC
HCT: 37.3 % (ref 36.0–46.0)
Hemoglobin: 11.9 g/dL — ABNORMAL LOW (ref 12.0–15.0)
MCH: 26.6 pg (ref 26.0–34.0)
MCHC: 31.9 g/dL (ref 30.0–36.0)
MCV: 83.3 fL (ref 80.0–100.0)
Platelets: 82 10*3/uL — ABNORMAL LOW (ref 150–400)
RBC: 4.48 MIL/uL (ref 3.87–5.11)
RDW: 13.8 % (ref 11.5–15.5)
WBC: 4.1 10*3/uL (ref 4.0–10.5)
nRBC: 0 % (ref 0.0–0.2)

## 2020-05-01 LAB — I-STAT BETA HCG BLOOD, ED (MC, WL, AP ONLY): I-stat hCG, quantitative: <5 m[IU]/mL (ref <5)

## 2020-05-01 MED ORDER — ACETAMINOPHEN 325 MG PO TABS
650.0000 mg | ORAL_TABLET | Freq: Once | ORAL | Status: AC | PRN
  Administered 2020-05-01: 650 mg via ORAL
  Filled 2020-05-01: qty 2

## 2020-05-01 NOTE — ED Triage Notes (Signed)
Pt sent from [facility] for further evaluation of COVID symptoms. Pt has had sob, fever, fatigue and generalized weakness for the past week. Rapid test at [facility] was positive today.

## 2020-05-02 LAB — RESPIRATORY PANEL BY RT PCR (FLU A&B, COVID)
Influenza A by PCR: NEGATIVE
Influenza B by PCR: NEGATIVE
SARS Coronavirus 2 by RT PCR: POSITIVE — AB

## 2020-05-02 MED ORDER — ONDANSETRON 4 MG PO TBDP
4.0000 mg | ORAL_TABLET | Freq: Once | ORAL | Status: AC
  Administered 2020-05-02: 4 mg via ORAL
  Filled 2020-05-02: qty 1

## 2020-05-02 MED ORDER — KETOROLAC TROMETHAMINE 60 MG/2ML IM SOLN
15.0000 mg | Freq: Once | INTRAMUSCULAR | Status: AC
  Administered 2020-05-02: 15 mg via INTRAMUSCULAR
  Filled 2020-05-02: qty 2

## 2020-05-02 MED ORDER — ONDANSETRON 4 MG PO TBDP: 4.0000 mg | ORAL_TABLET | Freq: Three times a day (TID) | ORAL | 0 refills | Status: DC | PRN

## 2020-05-02 MED ORDER — BENZONATATE 100 MG PO CAPS: 100.0000 mg | ORAL_CAPSULE | Freq: Three times a day (TID) | ORAL | 0 refills | Status: DC

## 2020-05-02 MED ORDER — HYDROCOD POLST-CPM POLST ER 10-8 MG/5ML PO SUER
5.0000 mL | Freq: Once | ORAL | Status: AC
  Administered 2020-05-02: 5 mL via ORAL
  Filled 2020-05-02: qty 5

## 2020-05-02 NOTE — Discharge Instructions (Addendum)
Take tylenol 2 pills 4 times a day and motrin 4 pills 3 times a day.  Drink plenty of fluids.  Return for worsening shortness of breath, headache, confusion. Follow up with your family doctor.   

## 2020-05-02 NOTE — ED Provider Notes (Signed)
[facility] EMERGENCY DEPARTMENT Provider Note   CSN: [id number] Arrival date & time: 05/01/20  1314     History Chief Complaint  Patient presents with  . Covid Positive  . Weakness  . Shortness of Breath    Ebony [name] is a 39 y.o. female.  39 yo F with a cc of COVID-19 infection.  Going on for the past 3 to 4 days.  Had a positive test at home.  Has had a cough fevers chills chest pain headache vomiting.  Denies diarrhea.  Has been able to eat and drink.  Has felt just miserable at home.  Fevers coming and going.  Went to urgent care and sent over here for evaluation.  The history is provided by the patient.  Weakness Severity:  Moderate Onset quality:  Gradual Duration:  4 days Timing:  Constant Progression:  Worsening Chronicity:  New Relieved by:  Nothing Worsened by:  Nothing Ineffective treatments:  None tried Associated symptoms: chest pain, cough, fever, myalgias, nausea, shortness of breath and vomiting   Associated symptoms: no abdominal pain, no arthralgias, no diarrhea, no dizziness, no dysuria, no headaches and no urgency   Shortness of Breath Associated symptoms: chest pain, cough, fever and vomiting   Associated symptoms: no abdominal pain, no headaches and no wheezing        Past Medical History:  Diagnosis Date  . Anemia    history  . Back pain   . Headache(784.0)    otc med prn  . Hypertension    gestational  . 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 Neuromuscular disorder (HCC)   . PONV (postoperative nausea and vomiting)   . PSVT (paroxysmal supraventricular tachycardia) (HCC)     There are no problems to display for this patient.   Past Surgical History:  Procedure Laterality Date  . CESAREAN SECTION  12/2008  . CESAREAN SECTION N/A 08/24/2012   Procedure: CESAREAN SECTION;  Surgeon: Mickel Baas, MD;  Location: [facility];  Service: Obstetrics;  Laterality: N/A;  .  CESAREAN SECTION N/A 07/29/2014   Procedure: CESAREAN SECTION;  Surgeon: Essie Hart, MD;  Location: [facility];  Service: Obstetrics;  Laterality: N/A;  . CESAREAN SECTION    . WISDOM TOOTH EXTRACTION       OB History    Gravida  7   Para  6   Term  6   Preterm  0   AB  0   Living  2     SAB  0   TAB  0   Ectopic  0   Multiple      Live Births  2           Family History  Problem Relation Age of Onset  . Stroke Mother     Social History   Tobacco Use  . Smoking status: Never Smoker  . Smokeless tobacco: Never Used  Substance Use Topics  . Alcohol use: No  . Drug use: No    Home Medications Prior to Admission medications   Medication Sig Start Date End Date Taking? Authorizing Provider  acetaminophen (TYLENOL) 325 MG tablet Take 325-650 mg by mouth every 6 (six) hours as needed for moderate pain.    [provider]  benzonatate (TESSALON) 100 MG capsule Take 1 capsule (100 mg total) by mouth every 8 (eight) hours. 05/02/20   Melene Plan, DO  metoprolol succinate (TOPROL XL) 25 MG 24 hr tablet Take 1 tablet (  25 mg total) by mouth daily. 03/07/20   Camnitz, Will Daphine Deutscher, MD  ondansetron (ZOFRAN ODT) 4 MG disintegrating tablet Take 1 tablet (4 mg total) by mouth every 8 (eight) hours as needed for nausea or vomiting. 05/02/20   Melene Plan, DO    Allergies    Patient has no known allergies.  Review of Systems   Review of Systems  Constitutional: Positive for chills and fever.  HENT: Negative for congestion and rhinorrhea.   Eyes: Negative for redness and visual disturbance.  Respiratory: Positive for cough and shortness of breath. Negative for wheezing.   Cardiovascular: Positive for chest pain. Negative for palpitations.  Gastrointestinal: Positive for nausea and vomiting. Negative for abdominal pain and diarrhea.  Genitourinary: Negative for dysuria and urgency.  Musculoskeletal: Positive for myalgias. Negative for arthralgias.  Skin: Negative for  pallor and wound.  Neurological: Positive for weakness. Negative for dizziness and headaches.    Physical Exam Updated Vital Signs BP 103/63   Pulse (!) 103   Temp 98.2 F (36.8 C) (Oral)   Resp 20   SpO2 97%   Physical Exam Vitals and nursing note reviewed.  Constitutional:      General: She is not in acute distress.    Appearance: She is well-developed. She is not diaphoretic.  HENT:     Head: Normocephalic and atraumatic.  Eyes:     Pupils: Pupils are equal, round, and reactive to light.  Cardiovascular:     Rate and Rhythm: Normal rate and regular rhythm.     Heart sounds: No murmur heard.  No friction rub. No gallop.   Pulmonary:     Effort: Pulmonary effort is normal.     Breath sounds: No wheezing or rales.  Abdominal:     General: There is no distension.     Palpations: Abdomen is soft.     Tenderness: There is no abdominal tenderness.  Musculoskeletal:        General: No tenderness.     Cervical back: Normal range of motion and neck supple.  Skin:    General: Skin is warm and dry.  Neurological:     Mental Status: She is alert and oriented to person, place, and time.  Psychiatric:        Behavior: Behavior normal.     ED Results / Procedures / Treatments   Labs (all labs ordered are listed, but only abnormal results are displayed) Labs Reviewed  BASIC METABOLIC PANEL - Abnormal; Notable for the following components:      Result Value   Sodium 134 (*)    Glucose, Bld 124 (*)    Calcium 8.7 (*)    All other components within normal limits  CBC - Abnormal; Notable for the following components:   Hemoglobin 11.9 (*)    Platelets 82 (*)    All other components within normal limits  RESPIRATORY PANEL BY RT PCR (FLU A&B, COVID)  I-STAT BETA HCG BLOOD, ED ([facility], [facility], [facility] ONLY)    EKG None  Radiology DG Chest Portable 1 View  Result Date: 05/01/2020 CLINICAL DATA:  COVID-19 positivity with shortness of breath EXAM: PORTABLE CHEST 1 VIEW COMPARISON:   03/19/2020 FINDINGS: The heart size and mediastinal contours are within normal limits. Both lungs are clear. The visualized skeletal structures are unremarkable. IMPRESSION: No active disease. Electronically Signed   By: Alcide Clever M.D.   On: 05/01/2020 15:17    Procedures Procedures (including critical care time)  Medications Ordered in ED Medications  chlorpheniramine-HYDROcodone (TUSSIONEX) 10-8 MG/5ML suspension 5 mL (has no administration in time range)  ketorolac (TORADOL) injection 15 mg (has no administration in time range)  ondansetron (ZOFRAN-ODT) disintegrating tablet 4 mg (has no administration in time range)  acetaminophen (TYLENOL) tablet 650 mg (650 mg Oral Given 05/01/20 1344)    ED Course  I have reviewed the triage vital signs and the nursing notes.  Pertinent labs & imaging results that were available during my care of the patient were reviewed by me and considered in my medical decision making (see chart for details).    MDM Rules/Calculators/A&P                          39 yo F with a chief complaints of COVID-19 infection.  She is now on day 4 of symptoms.  She is mildly tachycardic otherwise with unremarkable vital signs.  Oxygen saturation at 100% while I am in the room.  Chest x-ray viewed by me without focal of treated pneumothorax.  Will treat supportively.  Have the patient follow-up with the post Covid clinic.  12:55 AM:  I have discussed the diagnosis/risks/treatment options with the patient and believe the pt to be eligible for discharge home to follow-up with PCP. We also discussed returning to the ED immediately if new or worsening sx occur. We discussed the sx which are most concerning (e.g., sudden worsening sob, fever, inability to tolerate by mouth) that necessitate immediate return. Medications administered to the patient during their visit and any new prescriptions provided to the patient are listed below.  Medications given during this  visit Medications  chlorpheniramine-HYDROcodone (TUSSIONEX) 10-8 MG/5ML suspension 5 mL (has no administration in time range)  ketorolac (TORADOL) injection 15 mg (has no administration in time range)  ondansetron (ZOFRAN-ODT) disintegrating tablet 4 mg (has no administration in time range)  acetaminophen (TYLENOL) tablet 650 mg (650 mg Oral Given 05/01/20 1344)     The patient appears reasonably screen and/or stabilized for discharge and I doubt any other medical condition or other [facility] requiring further screening, evaluation, or treatment in the ED at this time prior to discharge.   Final Clinical Impression(s) / ED Diagnoses Final diagnoses:  COVID-19 virus infection    Rx / DC Orders ED Discharge Orders         Ordered    benzonatate (TESSALON) 100 MG capsule  Every 8 hours        05/02/20 0051    ondansetron (ZOFRAN ODT) 4 MG disintegrating tablet  Every 8 hours PRN        05/02/20 0051           Melene Plan, DO 05/02/20 4098

## 2020-05-03 ENCOUNTER — Telehealth: Payer: Self-pay | Admitting: Physician Assistant

## 2020-05-03 NOTE — Telephone Encounter (Addendum)
Called to discuss with patient about Covid symptoms and the use of sotrovimab, bamlanivimab/etesevimab or casirivimab/imdevimab, a monoclonal antibody infusion for those with mild to moderate Covid symptoms and at a high risk of hospitalization.  Pt is qualified for this infusion at the [facility] center due to; Specific high risk criteria : BMI > 25 and Cardiovascular disease or hypertension, high SVI.    Message left to call back our hotline [phone].  Cline Crock PA-C  [facility]

## 2020-05-09 DIAGNOSIS — M545 Low back pain, unspecified: Secondary | ICD-10-CM | POA: Diagnosis not present

## 2020-05-09 DIAGNOSIS — U071 COVID-19: Secondary | ICD-10-CM | POA: Diagnosis not present

## 2020-05-09 DIAGNOSIS — R051 Acute cough: Secondary | ICD-10-CM | POA: Diagnosis not present

## 2020-05-20 DIAGNOSIS — U071 COVID-19: Secondary | ICD-10-CM | POA: Diagnosis not present

## 2020-05-20 DIAGNOSIS — R110 Nausea: Secondary | ICD-10-CM | POA: Diagnosis not present

## 2020-05-20 DIAGNOSIS — G4489 Other headache syndrome: Secondary | ICD-10-CM | POA: Diagnosis not present

## 2020-05-20 DIAGNOSIS — R202 Paresthesia of skin: Secondary | ICD-10-CM | POA: Diagnosis not present

## 2020-06-06 DIAGNOSIS — J069 Acute upper respiratory infection, unspecified: Secondary | ICD-10-CM | POA: Diagnosis not present

## 2020-06-06 DIAGNOSIS — Z20822 Contact with and (suspected) exposure to covid-19: Secondary | ICD-10-CM | POA: Diagnosis not present

## 2020-06-15 ENCOUNTER — Ambulatory Visit (INDEPENDENT_AMBULATORY_CARE_PROVIDER_SITE_OTHER): Payer: BC Managed Care – PPO | Admitting: Cardiology

## 2020-06-15 ENCOUNTER — Other Ambulatory Visit: Payer: Self-pay

## 2020-06-15 ENCOUNTER — Encounter: Payer: Self-pay | Admitting: Cardiology

## 2020-06-15 VITALS — BP 102/66 | HR 74 | Ht 66.0 in | Wt 185.4 lb

## 2020-06-15 DIAGNOSIS — I471 Supraventricular tachycardia: Secondary | ICD-10-CM | POA: Diagnosis not present

## 2020-06-15 MED ORDER — METOPROLOL SUCCINATE ER 25 MG PO TB24
25.0000 mg | ORAL_TABLET | Freq: Every day | ORAL | 1 refills | Status: DC
Start: 2020-06-15 — End: 2021-02-05

## 2020-06-15 NOTE — Patient Instructions (Signed)
Medication Instructions:  Your physician recommends that you continue on your current medications as directed. Please refer to the Current Medication list given to you today.  *If you need a refill on your cardiac medications before your next appointment, please call your pharmacy*   Lab Work: None ordered   Testing/Procedures: None ordered   Follow-Up: At [facility], you and your health needs are our priority.  As part of our continuing mission to provide you with exceptional heart care, we have created designated Provider Care Teams.  These Care Teams include your primary Cardiologist (physician) and Advanced Practice Providers (APPs -  Physician Assistants and Nurse Practitioners) who all work together to provide you with the care you need, when you need it.  We recommend signing up for the patient portal called "MyChart".  Sign up information is provided on this After Visit Summary.  MyChart is used to connect with patients for Virtual Visits (Telemedicine).  Patients are able to view lab/test results, encounter notes, upcoming appointments, etc.  Non-urgent messages can be sent to your provider as well.   To learn more about what you can do with MyChart, go to [email or web address].    Your next appointment:   6 month(s)  The format for your next appointment:   In Person  Provider:   Will Camnitz, MD   Thank you for choosing [facility]!!   Kveon Casanas, RN [phone]     

## 2020-06-15 NOTE — Progress Notes (Signed)
Electrophysiology Office Note   Date:  06/15/2020   ID:  Patsey, Pitstick 06-17-1981, MRN [id number]  PCP:  Patient, No Pcp Per  Cardiologist:   Primary Electrophysiologist:  Roarke Marciano Jorja Loa, MD    Chief Complaint: SVT   History of Present Illness: Crystalmarie Yasin is a 39 y.o. female who is being seen today for the evaluation of SVT at the request of Lezlie Lye, Irma M, *. Presenting today for electrophysiology evaluation.  She has a history of hypertension, immune thrombocytopenia during pregnancy, and SVT.  Her palpitations go back 4 to 5 years and occur multiple times a year.  Symptoms occur spontaneously, though caffeine may be a trigger.  She has chest pain and dyspnea with her palpitations.  She has never required adenosine but has called EMS multiple times.  Palpitations usually resolve with oxygen therapy.  July 2021 she developed severe onset of palpitations.  She had left-sided chest pain and dyspnea.  She called EMS and on their arrival she had a heart rate of 180 bpm.  Vagal maneuvers were attempted but this did not convert her to sinus rhythm.  She received 6 mg of adenosine which converted to sinus rhythm.  Symptoms resolved after conversion.  Today, denies symptoms of palpitations, chest pain, shortness of breath, orthopnea, PND, lower extremity edema, claudication, dizziness, presyncope, syncope, bleeding, or neurologic sequela. The patient is tolerating medications without difficulties.  Since being seen she has done well.  She has had no further episodes of SVT.  She is tolerating her metoprolol without issue.  Of note she has missed a few doses, but has not had any arrhythmias.   Past Medical History:  Diagnosis Date  . Anemia    history  . Back pain   . Headache(784.0)    otc med prn  . Hypertension    gestational  . 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 Neuromuscular disorder (HCC)   . PONV (postoperative  nausea and vomiting)   . PSVT (paroxysmal supraventricular tachycardia) (HCC)    Past Surgical History:  Procedure Laterality Date  . CESAREAN SECTION  12/2008  . CESAREAN SECTION N/A 08/24/2012   Procedure: CESAREAN SECTION;  Surgeon: Mickel Baas, MD;  Location: [facility];  Service: Obstetrics;  Laterality: N/A;  . CESAREAN SECTION N/A 07/29/2014   Procedure: CESAREAN SECTION;  Surgeon: Essie Hart, MD;  Location: [facility];  Service: Obstetrics;  Laterality: N/A;  . CESAREAN SECTION    . WISDOM TOOTH EXTRACTION       Current Outpatient Medications  Medication Sig Dispense Refill  . acetaminophen (TYLENOL) 325 MG tablet Take 325-650 mg by mouth every 6 (six) hours as needed for moderate pain.    . metoprolol succinate (TOPROL XL) 25 MG 24 hr tablet Take 1 tablet (25 mg total) by mouth daily. 90 tablet 1   No current facility-administered medications for this visit.    Allergies:   Patient has no known allergies.   Social History:  The patient  reports that she has never smoked. She has never used smokeless tobacco. She reports that she does not drink alcohol and does not use drugs.   Family History:  The patient's family history includes Stroke in her mother.   ROS:  Please see the history of present illness.   Otherwise, review of systems is positive for none.   All other systems are reviewed and negative.   PHYSICAL EXAM: VS:  BP 102/66   Pulse 74  Ht 5\' 6"  (1.676 m)   Wt 185 lb 6.4 oz (84.1 kg)   LMP 05/26/2020   SpO2 99%   BMI 29.92 kg/m  , BMI Body mass index is 29.92 kg/m. GEN: Well nourished, well developed, in no acute distress  HEENT: normal  Neck: no JVD, carotid bruits, or masses Cardiac: RRR; no murmurs, rubs, or gallops,no edema  Respiratory:  clear to auscultation bilaterally, normal work of breathing GI: soft, nontender, nondistended, + BS MS: no deformity or atrophy  Skin: warm and dry Neuro:  Strength and sensation are intact Psych: euthymic mood, full  affect  EKG:  EKG is ordered today. Personal review of the ekg ordered shows sinus rhythm, rate 68  Recent Labs: 01/18/2020: ALT 25; Magnesium 1.8; TSH 1.102 05/01/2020: BUN 7; Creatinine, Ser 0.63; Hemoglobin 11.9; Platelets 82; Potassium 3.7; Sodium 134    Lipid Panel     Component Value Date/Time   CHOL 180 06/29/2019 1402   TRIG 60 06/29/2019 1402   HDL 56 06/29/2019 1402   CHOLHDL 3.2 06/29/2019 1402   LDLCALC 112 (H) 06/29/2019 1402     Wt Readings from Last 3 Encounters:  06/15/20 185 lb 6.4 oz (84.1 kg)  03/19/20 186 lb (84.4 kg)  03/07/20 186 lb 6.4 oz (84.6 kg)      Other studies Reviewed: Additional studies/ records that were reviewed today include: EMS run sheet ECG showing SVT personally reviewed Review of the above records today demonstrates:    TTE 02/01/20 1. Left ventricular ejection fraction, by estimation, is 60 to 65%. The  left ventricle has normal function. The left ventricle has no regional  wall motion abnormalities. Left ventricular diastolic parameters were  normal.  2. Right ventricular systolic function is normal. The right ventricular  size is normal. There is normal pulmonary artery systolic pressure.  3. The mitral valve is normal in structure. Trivial mitral valve  regurgitation. No evidence of mitral stenosis.  4. The aortic valve is normal in structure. Aortic valve regurgitation is  not visualized. No aortic stenosis is present.  5. The inferior vena cava is normal in size with greater than 50%  respiratory variability, suggesting right atrial pressure of 3 mmHg.    ASSESSMENT AND PLAN:  1.  SVT: Has had multiple episodes of tachypalpitations which have responded to adenosine.  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  She is currently on Toprol-XL which was increased to 25 mg at her last visit.  She currently feels well and has had no further episodes of SVT.  We Xiana Carns continue with  current management.  I have told her that she can take an extra dose of metoprolol if she has episodes of SVT.    Current medicines are reviewed at length with the patient today.   The patient does not have concerns regarding her medicines.  The following changes were made today: None  Labs/ tests ordered today include:  Orders Placed This Encounter  Procedures  . EKG 12-Lead     Disposition:   FU with Marytza Grandpre 6 months  Signed, Harlym Gehling 04/02/20, MD  06/15/2020 11:13 AM     [facility] [address] [place] [phone] (office) [phone] (fax)

## 2020-06-29 ENCOUNTER — Encounter: Payer: Self-pay | Admitting: Family Medicine

## 2020-06-29 ENCOUNTER — Ambulatory Visit (INDEPENDENT_AMBULATORY_CARE_PROVIDER_SITE_OTHER): Payer: BC Managed Care – PPO | Admitting: Family Medicine

## 2020-06-29 ENCOUNTER — Other Ambulatory Visit: Payer: Self-pay

## 2020-06-29 VITALS — BP 120/79 | HR 87 | Temp 97.6°F | Resp 17 | Ht 66.0 in | Wt 181.0 lb

## 2020-06-29 DIAGNOSIS — Z8679 Personal history of other diseases of the circulatory system: Secondary | ICD-10-CM | POA: Insufficient documentation

## 2020-06-29 DIAGNOSIS — E663 Overweight: Secondary | ICD-10-CM | POA: Diagnosis not present

## 2020-06-29 DIAGNOSIS — R42 Dizziness and giddiness: Secondary | ICD-10-CM | POA: Diagnosis not present

## 2020-06-29 DIAGNOSIS — Z0001 Encounter for general adult medical examination with abnormal findings: Secondary | ICD-10-CM | POA: Diagnosis not present

## 2020-06-29 DIAGNOSIS — Z0000 Encounter for general adult medical examination without abnormal findings: Secondary | ICD-10-CM | POA: Diagnosis not present

## 2020-06-29 DIAGNOSIS — F419 Anxiety disorder, unspecified: Secondary | ICD-10-CM | POA: Diagnosis not present

## 2020-06-29 DIAGNOSIS — Z8616 Personal history of COVID-19: Secondary | ICD-10-CM

## 2020-06-29 NOTE — Patient Instructions (Addendum)
On physical exam everything looks good.  I urged you to try and get some regular exercise at least 5 days a week for 30 minutes.  If you are having too much problem with anxiety please get some help, especially some counseling.  Be careful about your weight.  I think that after you are on the metoprolol for some more time that your side effects from it will get less.  If you persist with having the dizziness you should talk with your cardiologist again.  I am checking some labs on you to make sure you are not anemic or having other problems.  I believe that the left-sided leg symptoms are probably could be coming from your back, and if that gets to be too bad we can make a referral to a back specialist.  However I believe that being active is the best thing for you at this time.    Some of the symptoms you have with numbness in the arm and leg probably start from the back but are made worse by your anxiety worrying about stroke since your mother had a bad stroke.  When it has been 3 months since your Covid you should get the vaccinations.  Return if problems arise, otherwise come back for your annual physical examinations.    If you have lab work done today you will be contacted with your lab results within the next 2 weeks.  If you have not heard from [place] then please contact us. The fastest way to get your results is to register for My Chart.   IF you received an x-ray today, you will receive an invoice from [facility]. Please contact [facility] at [phone] with questions or concerns regarding your invoice.   IF you received labwork today, you will receive an invoice from [facility]. Please contact [facility] at [phone] with questions or concerns regarding your invoice.   Our billing staff will not be able to assist you with questions regarding bills from these companies.  You will be contacted with the lab results as soon as they are available. The fastest way  to get your results is to activate your My Chart account. Instructions are located on the last page of this paperwork. If you have not heard from [place] regarding the results in 2 weeks, please contact this office.

## 2020-06-29 NOTE — Progress Notes (Signed)
Patient ID: Ebony [name], female    DOB: 1980/12/31  Age: 39 y.o. MRN: [id number]  Chief Complaint  Patient presents with  . Annual Exam    Pt doing well, is reporting some dizziness after starting the metoprolol some and goes noticed after starting med has not improved like she had hoped it would     Subjective:   Patient is here for her annual physical examination.  She has had some problems in the recent months.  She has PSVT.  Apparently it has been proposed to her that she might have an ablation done.  She does not want that.  She is on metoprolol and has had a little bit of dizziness at times that she attributes to that.  She is anxious about it.  She gets some shortness of breath, which seems to be more anxiety related.  Not having palpitations right now.  She has episodes of feeling numb down the left side of her body.  She has a history of back problems and left leg numbness from that but she gets left arm and leg numbness at times.  She thinks she is worried about that because her mother has had a major stroke and hemiparesis.  Past medical history: Cesarean section x3.  The third was a term stillborn. Medical illnesses: PSVT Covid last month Medications: Metoprolol 25 daily Allergies: None known  Family history: Mother has had a stroke.  I did not get the history of her father.  Social history: Married with 2 children.  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  She is a Barrister's clerk Muslim.  Grew up in [place] and is a [place] citizen having immigrated here.  She speaks [place], [place], and [place].  Review of systems: Constitutional: A little bit of dizziness but no major headaches except for today she thinks she has a little 1 HEENT: Unremarkable Cardiovascular: As above Respiratory: Nonspecific shortness of breath when anxious GI: Unremarkable GU: Unremarkable Mus skeletal: Left numbness as noted above Dermatologic: Unremarkable Psychiatric:  Unremarkable Endocrinologic: Unremarkable Neurologic: Unremarkable     Current allergies, medications, problem list, past/family and social histories reviewed.  Objective:  BP 120/79   Pulse 87   Temp 97.6 F (36.4 C) (Temporal)   Resp 17   Ht 5\' 6"  (1.676 m)   Wt 181 lb (82.1 kg)   SpO2 100%   BMI 29.21 kg/m   A little overweight, alert and pleasant.  TMs normal.  Eyes PERRL.  EOMs intact.  Throat clear and teeth good.  Neck supple without nodes or thyromegaly.  No carotid bruits.  Chest is clear to auscultation.  Heart rate without murmurs, gallops, or arrhythmias.  Abdomen soft that mass tenderness.  Good motion of both sides of her body.  No weakness.  Pap and pelvic not done.  Assessment & Plan:   Assessment: 1. Routine general medical examination at a health care facility   2. Anxiety   3. History of paroxysmal supraventricular tachycardia   4. Overweight   5. Dizziness   6. History of COVID-19       Plan: See instructions.  Orders Placed This Encounter  Procedures  . Comprehensive metabolic panel  . CBC  . Iron and TIBC    No orders of the defined types were placed in this encounter.        Patient Instructions   On physical exam everything looks good.  I urged you to try and get some regular  exercise at least 5 days a week for 30 minutes.  If you are having too much problem with anxiety please get some help, especially some counseling.  Be careful about your weight.  I think that after you are on the metoprolol for some more time that your side effects from it will get less.  If you persist with having the dizziness you should talk with your cardiologist again.  I am checking some labs on you to make sure you are not anemic or having other problems.  I believe that the left-sided leg symptoms are probably could be coming from your back, and if that gets to be too bad we can make a referral to a back specialist.  However I believe that being  active is the best thing for you at this time.    Some of the symptoms you have with numbness in the arm and leg probably start from the back but are made worse by your anxiety worrying about stroke since your mother had a bad stroke.  When it has been 3 months since your Covid you should get the vaccinations.  Return if problems arise, otherwise come back for your annual physical examinations.    If you have lab work done today you will be contacted with your lab results within the next 2 weeks.  If you have not heard from [place] then please contact us. The fastest way to get your results is to register for My Chart.   IF you received an x-ray today, you will receive an invoice from [facility]. Please contact [facility] at [phone] with questions or concerns regarding your invoice.   IF you received labwork today, you will receive an invoice from [facility]. Please contact [facility] at [phone] with questions or concerns regarding your invoice.   Our billing staff will not be able to assist you with questions regarding bills from these companies.  You will be contacted with the lab results as soon as they are available. The fastest way to get your results is to activate your My Chart account. Instructions are located on the last page of this paperwork. If you have not heard from [place] regarding the results in 2 weeks, please contact this office.        Return in about 1 year (around 06/29/2021).   Janace Hoard, MD 06/29/2020

## 2020-06-30 LAB — COMPREHENSIVE METABOLIC PANEL
ALT: 40 IU/L — ABNORMAL HIGH (ref 0–32)
AST: 28 IU/L (ref 0–40)
Albumin/Globulin Ratio: 1.3 (ref 1.2–2.2)
Albumin: 4.3 g/dL (ref 3.8–4.8)
Alkaline Phosphatase: 54 IU/L (ref 44–121)
BUN/Creatinine Ratio: 24 — ABNORMAL HIGH (ref 9–23)
BUN: 15 mg/dL (ref 6–24)
Bilirubin Total: 0.4 mg/dL (ref 0.0–1.2)
CO2: 24 mmol/L (ref 20–29)
Calcium: 9.3 mg/dL (ref 8.7–10.2)
Chloride: 105 mmol/L (ref 96–106)
Creatinine, Ser: 0.62 mg/dL (ref 0.57–1.00)
GFR calc Af Amer: 130 mL/min/{1.73_m2} (ref >59)
GFR calc non Af Amer: 112 mL/min/{1.73_m2} (ref >59)
Globulin, Total: 3.3 g/dL (ref 1.5–4.5)
Glucose: 88 mg/dL (ref 65–99)
Potassium: 4.4 mmol/L (ref 3.5–5.2)
Sodium: 141 mmol/L (ref 134–144)
Total Protein: 7.6 g/dL (ref 6.0–8.5)

## 2020-06-30 LAB — CBC
Hematocrit: 38.3 % (ref 34.0–46.6)
Hemoglobin: 12.4 g/dL (ref 11.1–15.9)
MCH: 26.3 pg — ABNORMAL LOW (ref 26.6–33.0)
MCHC: 32.4 g/dL (ref 31.5–35.7)
MCV: 81 fL (ref 79–97)
Platelets: 139 10*3/uL — ABNORMAL LOW (ref 150–450)
RBC: 4.71 x10E6/uL (ref 3.77–5.28)
RDW: 13.7 % (ref 11.7–15.4)
WBC: 6.7 10*3/uL (ref 3.4–10.8)

## 2020-06-30 LAB — IRON AND TIBC
Iron Saturation: 9 % — CL (ref 15–55)
Iron: 33 ug/dL (ref 27–159)
Total Iron Binding Capacity: 350 ug/dL (ref 250–450)
UIBC: 317 ug/dL (ref 131–425)

## 2020-07-04 ENCOUNTER — Other Ambulatory Visit: Payer: Self-pay

## 2020-07-04 ENCOUNTER — Encounter (HOSPITAL_COMMUNITY): Payer: Self-pay

## 2020-07-04 ENCOUNTER — Emergency Department (HOSPITAL_COMMUNITY)
Admission: EM | Admit: 2020-07-04 | Discharge: 2020-07-04 | Disposition: A | Discharge status: Home / Self Care | Payer: BC Managed Care – PPO | Attending: Emergency Medicine | Admitting: Emergency Medicine

## 2020-07-04 DIAGNOSIS — R0602 Shortness of breath: Secondary | ICD-10-CM | POA: Diagnosis not present

## 2020-07-04 DIAGNOSIS — R079 Chest pain, unspecified: Secondary | ICD-10-CM | POA: Diagnosis not present

## 2020-07-04 DIAGNOSIS — R0789 Other chest pain: Secondary | ICD-10-CM | POA: Diagnosis not present

## 2020-07-04 DIAGNOSIS — Z5321 Procedure and treatment not carried out due to patient leaving prior to being seen by health care provider: Secondary | ICD-10-CM | POA: Diagnosis not present

## 2020-07-04 DIAGNOSIS — R064 Hyperventilation: Secondary | ICD-10-CM | POA: Diagnosis not present

## 2020-07-04 LAB — CBC
HCT: 36.3 % (ref 36.0–46.0)
Hemoglobin: 12 g/dL (ref 12.0–15.0)
MCH: 27.4 pg (ref 26.0–34.0)
MCHC: 33.1 g/dL (ref 30.0–36.0)
MCV: 82.9 fL (ref 80.0–100.0)
Platelets: 116 10*3/uL — ABNORMAL LOW (ref 150–400)
RBC: 4.38 MIL/uL (ref 3.87–5.11)
RDW: 14.3 % (ref 11.5–15.5)
WBC: 6.1 10*3/uL (ref 4.0–10.5)
nRBC: 0 % (ref 0.0–0.2)

## 2020-07-04 LAB — BASIC METABOLIC PANEL
Anion gap: 10 (ref 5–15)
BUN: 9 mg/dL (ref 6–20)
CO2: 22 mmol/L (ref 22–32)
Calcium: 9.2 mg/dL (ref 8.9–10.3)
Chloride: 106 mmol/L (ref 98–111)
Creatinine, Ser: 0.48 mg/dL (ref 0.44–1.00)
GFR, Estimated: >60 mL/min (ref >60)
Glucose, Bld: 91 mg/dL (ref 70–99)
Potassium: 3.6 mmol/L (ref 3.5–5.1)
Sodium: 138 mmol/L (ref 135–145)

## 2020-07-04 LAB — I-STAT BETA HCG BLOOD, ED (MC, WL, AP ONLY): I-stat hCG, quantitative: <5 m[IU]/mL (ref <5)

## 2020-07-04 LAB — TROPONIN I (HIGH SENSITIVITY): Troponin I (High Sensitivity): 12 ng/L (ref <18)

## 2020-07-04 NOTE — ED Triage Notes (Signed)
Pt BIB [facility] from work. Upon EMS arrival pt lying on the floor surrounded by co-workers. Pt very anxious, teeth chattering, c/o SOB and chest pressure. Pt took 1 dose of her metoprolol w/some relief. Pt reports CP is almost gone.   HR 80 SBP 134/78 RR 16 100% RA

## 2020-07-04 NOTE — ED Notes (Signed)
Pt stated they were leaving  

## 2020-08-19 DIAGNOSIS — R519 Headache, unspecified: Secondary | ICD-10-CM | POA: Diagnosis not present

## 2020-08-19 DIAGNOSIS — H8111 Benign paroxysmal vertigo, right ear: Secondary | ICD-10-CM | POA: Diagnosis not present

## 2020-08-19 DIAGNOSIS — Z1152 Encounter for screening for COVID-19: Secondary | ICD-10-CM | POA: Diagnosis not present

## 2020-09-03 DIAGNOSIS — J3089 Other allergic rhinitis: Secondary | ICD-10-CM | POA: Diagnosis not present

## 2020-09-03 DIAGNOSIS — J069 Acute upper respiratory infection, unspecified: Secondary | ICD-10-CM | POA: Diagnosis not present

## 2020-09-03 DIAGNOSIS — Z1152 Encounter for screening for COVID-19: Secondary | ICD-10-CM | POA: Diagnosis not present

## 2020-09-18 DIAGNOSIS — Z20822 Contact with and (suspected) exposure to covid-19: Secondary | ICD-10-CM | POA: Diagnosis not present

## 2020-09-18 DIAGNOSIS — Z1152 Encounter for screening for COVID-19: Secondary | ICD-10-CM | POA: Diagnosis not present

## 2020-12-02 DIAGNOSIS — R079 Chest pain, unspecified: Secondary | ICD-10-CM | POA: Diagnosis not present

## 2020-12-02 DIAGNOSIS — R0602 Shortness of breath: Secondary | ICD-10-CM | POA: Diagnosis not present

## 2020-12-02 DIAGNOSIS — M6281 Muscle weakness (generalized): Secondary | ICD-10-CM | POA: Diagnosis not present

## 2020-12-08 ENCOUNTER — Ambulatory Visit: Payer: BC Managed Care – PPO | Admitting: Cardiology

## 2020-12-08 NOTE — Progress Notes (Deleted)
Electrophysiology Office Note   Date:  12/08/2020   ID:  Ebony [name] May 18, 1981, MRN [id number]  PCP:  Patient, No Pcp Per (Inactive)  Cardiologist:   Primary Electrophysiologist:  [name], MD    Chief Complaint: SVT   History of Present Illness: Ebony [name] is a 40 y.o. female who is being seen today for the evaluation of SVT at the request of No ref. provider found. Presenting today for electrophysiology evaluation.  She has a history significant for hypertension, immune thrombocytopenia during pregnancy, and SVT.  Her palpitations go back to 4 to 5 years that occurred multiple times.  They do occur spontaneously, though caffeine may be a trigger.  She has not had chest pain and dyspnea associated with her palpitations.  These are resolved with oxygen therapy.  July 2021 she developed a severe onset of SVT.  She had left-sided chest pain.  She presented to the hospital with heart rates in the 180s.  She received 6 mg of adenosine with termination of SVT to sinus rhythm.  Symptoms resolved after conversion.  Today, denies symptoms of palpitations, chest pain, shortness of breath, orthopnea, PND, lower extremity edema, claudication, dizziness, presyncope, syncope, bleeding, or neurologic sequela. The patient is tolerating medications without difficulties. 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) 06/14/2014   Neuromuscular disorder (HCC)    PONV (postoperative nausea and vomiting)    PSVT (paroxysmal supraventricular tachycardia) (HCC)    Past Surgical History:  Procedure Laterality Date   CESAREAN SECTION  12/2008   CESAREAN SECTION N/A 08/24/2012   Procedure: CESAREAN SECTION;  Surgeon: Mickel Baas, MD;  Location: [facility];  Service: Obstetrics;  Laterality: N/A;   CESAREAN  SECTION N/A 07/29/2014   Procedure: CESAREAN SECTION;  Surgeon: Essie Hart, MD;  Location: [facility];  Service: Obstetrics;  Laterality: N/A;   CESAREAN SECTION     WISDOM TOOTH EXTRACTION       Current Outpatient Medications  Medication Sig Dispense Refill   acetaminophen (TYLENOL) 325 MG tablet Take 325-650 mg by mouth every 6 (six) hours as needed for moderate pain.     metoprolol succinate (TOPROL XL) 25 MG 24 hr tablet Take 1 tablet (25 mg total) by mouth daily. 90 tablet 1   No current facility-administered medications for this visit.    Allergies:   Patient has no known allergies.   Social History:  The patient  reports that she has never smoked. She has never used smokeless tobacco. She reports that she does not drink alcohol and does not use drugs.   Family History:  The patient's family history includes Stroke in her mother.   ROS:  Please see the history of present illness.   Otherwise, review of systems is positive for none.   All other systems are reviewed and negative.   PHYSICAL EXAM: VS:  There were no vitals taken for this visit. , BMI There is no height or weight on file to calculate BMI. GEN: Well nourished, well developed, in no acute distress  HEENT: normal  Neck: no JVD, carotid bruits, or masses Cardiac: ***RRR; no murmurs, rubs, or gallops,no edema  Respiratory:  clear to auscultation bilaterally, normal work of breathing GI: soft, nontender, nondistended, + BS MS: no deformity  or atrophy  Skin: warm and dry Neuro:  Strength and sensation are intact Psych: euthymic mood, full affect  EKG:  EKG {ACTION; IS/IS [id number]} ordered today. Personal review of the ekg ordered *** shows ***   Recent Labs: 01/18/2020: Magnesium 1.8; TSH 1.102 06/29/2020: ALT 40 07/04/2020: BUN 9; Creatinine, Ser 0.48; Hemoglobin 12.0; Platelets 116; Potassium 3.6; Sodium 138    Lipid Panel     Component Value Date/Time   CHOL 180 06/29/2019 1402   TRIG 60 06/29/2019 1402    HDL 56 06/29/2019 1402   CHOLHDL 3.2 06/29/2019 1402   LDLCALC 112 (H) 06/29/2019 1402     Wt Readings from Last 3 Encounters:  07/04/20 181 lb (82.1 kg)  06/29/20 181 lb (82.1 kg)  06/15/20 185 lb 6.4 oz (84.1 kg)      Other studies Reviewed: Additional studies/ records that were reviewed today include: EMS run sheet ECG showing SVT personally reviewed Review of the above records today demonstrates:     TTE 02/01/20  1. Left ventricular ejection fraction, by estimation, is 60 to 65%. The  left ventricle has normal function. The left ventricle has no regional  wall motion abnormalities. Left ventricular diastolic parameters were  normal.   2. Right ventricular systolic function is normal. The right ventricular  size is normal. There is normal pulmonary artery systolic pressure.   3. The mitral valve is normal in structure. Trivial mitral valve  regurgitation. No evidence of mitral stenosis.   4. The aortic valve is normal in structure. Aortic valve regurgitation is  not visualized. No aortic stenosis is present.   5. The inferior vena cava is normal in size with greater than 50%  respiratory variability, suggesting right atrial pressure of 3 mmHg.    ASSESSMENT AND PLAN:  1.  SVT: Has had multiple episodes of tachypalpitations which have responded to adenosine.  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  This would be amenable to ablation but she has been hesitant.  She is currently on Toprol-XL.***   Current medicines are reviewed at length with the patient today.   The patient does not have concerns regarding her medicines.  The following changes were made today: ***  Labs/ tests ordered today include:  No orders of the defined types were placed in this encounter.    Disposition:   FU with Anacaren Kohan *** months  Signed, Raider Valbuena Jorja Loa, MD  12/08/2020 8:42 AM     [facility] [address] [place] [address] [phone] (office) [phone]  (fax)

## 2020-12-12 ENCOUNTER — Other Ambulatory Visit: Payer: Self-pay

## 2020-12-12 ENCOUNTER — Ambulatory Visit (HOSPITAL_COMMUNITY)
Admission: EM | Admit: 2020-12-12 | Discharge: 2020-12-12 | Disposition: A | Discharge status: Home / Self Care | Payer: BC Managed Care – PPO

## 2020-12-12 ENCOUNTER — Encounter (HOSPITAL_COMMUNITY): Payer: Self-pay | Admitting: Emergency Medicine

## 2020-12-12 DIAGNOSIS — Z751 Person awaiting admission to adequate facility elsewhere: Secondary | ICD-10-CM

## 2020-12-12 NOTE — ED Triage Notes (Signed)
Patient presents to [facility] for assessment of left leg pain, coming from her buttocks down the back of her leg.  States it started last night, but worse this morning, difficulty getting out of bed.  Patient also c/o left arm heaviness and weakness, which has been going on for over a week.  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  Patient c/o intermittent dizziness.

## 2020-12-12 NOTE — ED Notes (Signed)
Patient is being discharged from the Urgent Care and sent to the Emergency Department via private vehicle. Per Vernona Rieger, APP, patient is in need of higher level of care due to left sided weakness and numbness, needs CT to rule out. Patient is aware and verbalizes understanding of plan of care.  Vitals:   12/12/20 1531  BP: 107/69  Pulse: 71  Resp: 18  Temp: 98.4 F (36.9 C)  SpO2: 100%

## 2020-12-13 ENCOUNTER — Emergency Department (HOSPITAL_BASED_OUTPATIENT_CLINIC_OR_DEPARTMENT_OTHER): Payer: BC Managed Care – PPO

## 2020-12-13 ENCOUNTER — Emergency Department (HOSPITAL_BASED_OUTPATIENT_CLINIC_OR_DEPARTMENT_OTHER): Payer: BC Managed Care – PPO | Admitting: Radiology

## 2020-12-13 ENCOUNTER — Emergency Department (HOSPITAL_BASED_OUTPATIENT_CLINIC_OR_DEPARTMENT_OTHER)
Admission: EM | Admit: 2020-12-13 | Discharge: 2020-12-13 | Disposition: A | Discharge status: Home / Self Care | Payer: BC Managed Care – PPO | Attending: Emergency Medicine | Admitting: Emergency Medicine

## 2020-12-13 ENCOUNTER — Encounter (HOSPITAL_BASED_OUTPATIENT_CLINIC_OR_DEPARTMENT_OTHER): Payer: Self-pay

## 2020-12-13 DIAGNOSIS — R079 Chest pain, unspecified: Secondary | ICD-10-CM | POA: Diagnosis not present

## 2020-12-13 DIAGNOSIS — M79602 Pain in left arm: Secondary | ICD-10-CM | POA: Diagnosis not present

## 2020-12-13 DIAGNOSIS — M25552 Pain in left hip: Secondary | ICD-10-CM | POA: Diagnosis not present

## 2020-12-13 DIAGNOSIS — R42 Dizziness and giddiness: Secondary | ICD-10-CM | POA: Insufficient documentation

## 2020-12-13 DIAGNOSIS — Z79899 Other long term (current) drug therapy: Secondary | ICD-10-CM | POA: Diagnosis not present

## 2020-12-13 DIAGNOSIS — I10 Essential (primary) hypertension: Secondary | ICD-10-CM | POA: Insufficient documentation

## 2020-12-13 DIAGNOSIS — R10819 Abdominal tenderness, unspecified site: Secondary | ICD-10-CM | POA: Diagnosis not present

## 2020-12-13 DIAGNOSIS — M79652 Pain in left thigh: Secondary | ICD-10-CM | POA: Insufficient documentation

## 2020-12-13 DIAGNOSIS — R0789 Other chest pain: Secondary | ICD-10-CM | POA: Diagnosis not present

## 2020-12-13 DIAGNOSIS — R519 Headache, unspecified: Secondary | ICD-10-CM | POA: Diagnosis not present

## 2020-12-13 DIAGNOSIS — M25512 Pain in left shoulder: Secondary | ICD-10-CM | POA: Diagnosis not present

## 2020-12-13 LAB — CBC
HCT: 39.8 % (ref 36.0–46.0)
Hemoglobin: 12.5 g/dL (ref 12.0–15.0)
MCH: 26.4 pg (ref 26.0–34.0)
MCHC: 31.4 g/dL (ref 30.0–36.0)
MCV: 84 fL (ref 80.0–100.0)
Platelets: 153 10*3/uL (ref 150–400)
RBC: 4.74 MIL/uL (ref 3.87–5.11)
RDW: 14.4 % (ref 11.5–15.5)
WBC: 6.7 10*3/uL (ref 4.0–10.5)
nRBC: 0 % (ref 0.0–0.2)

## 2020-12-13 LAB — BASIC METABOLIC PANEL
Anion gap: 8 (ref 5–15)
BUN: 12 mg/dL (ref 6–20)
CO2: 27 mmol/L (ref 22–32)
Calcium: 9.1 mg/dL (ref 8.9–10.3)
Chloride: 105 mmol/L (ref 98–111)
Creatinine, Ser: 0.63 mg/dL (ref 0.44–1.00)
GFR, Estimated: >60 mL/min (ref >60)
Glucose, Bld: 85 mg/dL (ref 70–99)
Potassium: 3.9 mmol/L (ref 3.5–5.1)
Sodium: 140 mmol/L (ref 135–145)

## 2020-12-13 LAB — PREGNANCY, URINE: Preg Test, Ur: NEGATIVE

## 2020-12-13 LAB — TROPONIN I (HIGH SENSITIVITY): Troponin I (High Sensitivity): <2 ng/L (ref <18)

## 2020-12-13 MED ORDER — KETOROLAC TROMETHAMINE 15 MG/ML IJ SOLN
15.0000 mg | Freq: Once | INTRAMUSCULAR | Status: AC
  Administered 2020-12-13: 15 mg via INTRAVENOUS
  Filled 2020-12-13: qty 1

## 2020-12-13 NOTE — ED Triage Notes (Signed)
Pt reports left side chest pain, left arm pain and left leg pain since last Friday.  Reports dizziness and shortness of breath. Pain is sometimes relieved by rest.

## 2020-12-13 NOTE — ED Provider Notes (Signed)
[facility] EMERGENCY DEPT Provider Note   CSN: [id number] Arrival date & time: 12/13/20  1029     History Chief Complaint  Patient presents with   Chest Pain    Ebony [name] is a 40 y.o. female.  Patient with history of SVT on metoprolol, anemia, high blood pressure gestational presents with left sided chest pain and arm pain worse with movement since last Friday.  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  No specific exertional symptoms.  No cardiac history except for SVT.  Patient had a little bit of lightheadedness.  No current shortness of breath.  No blood clot history or risk factors for this.      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) 06/14/2014   Neuromuscular disorder (HCC)    PONV (postoperative nausea and vomiting)    PSVT (paroxysmal supraventricular tachycardia) [facility])     Patient Active Problem List   Diagnosis Date Noted   History of paroxysmal supraventricular tachycardia 06/29/2020   Anxiety 06/29/2020   Overweight 06/29/2020    Past Surgical History:  Procedure Laterality Date   CESAREAN SECTION  12/2008   CESAREAN SECTION N/A 08/24/2012   Procedure: CESAREAN SECTION;  Surgeon: Mickel Baas, MD;  Location: [facility];  Service: Obstetrics;  Laterality: N/A;   CESAREAN SECTION N/A 07/29/2014   Procedure: CESAREAN SECTION;  Surgeon: Essie Hart, MD;  Location: [facility];  Service: Obstetrics;  Laterality: N/A;   CESAREAN SECTION     WISDOM TOOTH EXTRACTION       OB History     Gravida  7   Para  6   Term  6   Preterm  0   AB  0   Living  2      SAB  0   IAB  0   Ectopic  0   Multiple      Live Births  2           Family History  Problem Relation Age of Onset    Stroke Mother     Social History   Tobacco Use   Smoking status: Never   Smokeless tobacco: Never  Substance Use Topics   Alcohol use: No   Drug use: No    Home Medications Prior to Admission medications   Medication Sig Start Date End Date Taking? Authorizing Provider  acetaminophen (TYLENOL) 325 MG tablet Take 325-650 mg by mouth every 6 (six) hours as needed for moderate pain.   Yes [provider]  ibuprofen (ADVIL) 400 MG tablet Take 400 mg by mouth every 6 (six) hours as needed.   Yes [provider]  metoprolol succinate (TOPROL XL) 25 MG 24 hr tablet Take 1 tablet (25 mg total) by mouth daily. 06/15/20  Yes Camnitz, Andree Coss, MD    Allergies    Patient has no known allergies.  Review of Systems   Review of Systems  Constitutional:  Negative for chills and fever.  HENT:  Negative for congestion.   Eyes:  Negative for visual disturbance.  Respiratory:  Negative for shortness of breath.   Cardiovascular:  Positive for chest pain. Negative for leg swelling.  Gastrointestinal:  Negative for abdominal pain  and vomiting.  Genitourinary:  Negative for dysuria and flank pain.  Musculoskeletal:  Negative for back pain, gait problem, neck pain and neck stiffness.  Skin:  Negative for rash.  Neurological:  Positive for weakness. Negative for light-headedness, numbness and headaches.   Physical Exam Updated Vital Signs BP 127/77 (BP Location: Right Arm)   Pulse 82   Temp 97.9 F (36.6 C) (Oral)   Resp 17   Ht 5\' 6"  (1.676 m)   Wt 83.5 kg   LMP 12/10/2020   SpO2 100%   BMI 29.70 kg/m   Physical Exam Vitals and nursing note reviewed.  Constitutional:      General: She is not in acute distress.    Appearance: She is well-developed.  HENT:     Head: Normocephalic and atraumatic.     Mouth/Throat:     Mouth: Mucous membranes are dry.  Eyes:     General:        Right eye: No discharge.        Left eye: No discharge.     Conjunctiva/sclera:  Conjunctivae normal.  Neck:     Trachea: No tracheal deviation.  Cardiovascular:     Rate and Rhythm: Normal rate and regular rhythm.     Heart sounds: No murmur heard. Pulmonary:     Effort: Pulmonary effort is normal.     Breath sounds: Normal breath sounds.  Abdominal:     General: There is no distension.     Palpations: Abdomen is soft.     Tenderness: There is abdominal tenderness. There is no guarding.  Musculoskeletal:     Cervical back: Normal range of motion and neck supple. No rigidity.     Right lower leg: No edema.     Left lower leg: No edema.     Comments: And byPatient has pain with flexion left shoulder and left hip.  Normal strength with flexion extension of major joints upper and lower extremities equal bilateral.  Sensation intact bilateral.  Skin:    General: Skin is warm.     Capillary Refill: Capillary refill takes less than 2 seconds.     Findings: No rash.  Neurological:     General: No focal deficit present.     Mental Status: She is alert.     Cranial Nerves: No cranial nerve deficit.     Sensory: Sensation is intact.     Motor: Motor function is intact.     Coordination: Coordination is intact. Coordination normal. Finger-Nose-Finger Test normal.  Psychiatric:        Mood and Affect: Mood normal.    ED Results / Procedures / Treatments   Labs (all labs ordered are listed, but only abnormal results are displayed) Labs Reviewed  BASIC METABOLIC PANEL  CBC  PREGNANCY, URINE  TROPONIN I (HIGH SENSITIVITY)  TROPONIN I (HIGH SENSITIVITY)    EKG EKG Interpretation  Date/Time:  Wednesday December 13 2020 10:41:05 EDT Ventricular Rate:  73 PR Interval:  127 QRS Duration: 82 QT Interval:  379 QTC Calculation: 418 R Axis:   60 Text Interpretation: Sinus rhythm Abnormal R-wave progression, early transition Confirmed by [id number] [phone]) on 12/13/2020 12:24:06 PM  Radiology DG Chest 2 View  Result Date: 12/13/2020 CLINICAL DATA:  Chest pain.  EXAM: CHEST - 2 VIEW COMPARISON:  May 01, 2020. FINDINGS: The heart size and mediastinal contours are within normal limits. Both lungs are clear. No visible pleural effusions or pneumothorax. No acute osseous abnormality. IMPRESSION: No evidence of  acute cardiopulmonary disease. Electronically Signed   By: Feliberto Harts MD   On: 12/13/2020 11:15   CT Head Wo Contrast  Result Date: 12/13/2020 CLINICAL DATA:  Left-sided headaches EXAM: CT HEAD WITHOUT CONTRAST TECHNIQUE: Contiguous axial images were obtained from the base of the skull through the vertex without intravenous contrast. COMPARISON:  03/19/2020 FINDINGS: Brain: No evidence of acute infarction, hemorrhage, hydrocephalus, extra-axial collection or mass lesion/mass effect. Vascular: No hyperdense vessel or unexpected calcification. Skull: Normal. Negative for fracture or focal lesion. Sinuses/Orbits: No acute finding. Other: None. IMPRESSION: No acute abnormality noted. Electronically Signed   By: Alcide Clever M.D.   On: 12/13/2020 12:08    Procedures Procedures   Medications Ordered in ED Medications  ketorolac (TORADOL) 15 MG/ML injection 15 mg (15 mg Intravenous Given 12/13/20 1203)    ED Course  I have reviewed the triage vital signs and the nursing notes.  Pertinent labs & imaging results that were available during my care of the patient were reviewed by me and considered in my medical decision making (see chart for details).    MDM Rules/Calculators/A&P                          Patient presents with left chest arm and leg pain.  Patient has a strong musculoskeletal component with her active job and worse with movement and position.  Normal neurologic exam no signs of acute stroke and low pretest probability with pain aspect with movement of left arm and left leg.  CT scan was done as screening due to left-sided symptoms and reviewed unremarkable.  EKG no signs of ischemia, vital signs normal.  Toradol given for pain.  Blood  work reviewed and reassuring showing normal white blood cell count, normal hemoglobin, normal kidney electrolytes. Patient stable for close outpatient follow-up with primary doctor.  Troponin negative no signs of acute heart strain.   Final Clinical Impression(s) / ED Diagnoses Final diagnoses:  Left arm pain  Left thigh pain  Chest pain, unspecified type    Rx / DC Orders ED Discharge Orders     None        Blane Ohara, MD 12/13/20 1231

## 2020-12-13 NOTE — Discharge Instructions (Signed)
Your CT scan your head showed no signs of bleeding or stroke, your blood work was within normal limits and there is no signs of heart attack. Use Tylenol every 4 hours and ibuprofen every 6 hours as needed for pain. Take time off work to help your body heal. Follow-up with local doctors as discussed.

## 2020-12-21 ENCOUNTER — Ambulatory Visit: Payer: BC Managed Care – PPO | Admitting: Student

## 2020-12-27 ENCOUNTER — Telehealth: Payer: Self-pay

## 2020-12-27 NOTE — Telephone Encounter (Signed)
Contacted patient regarding FML paperwork that she needs completed by January 03, 2021.  Patient states that she had paperwork filled out last year and needs paperwork filled out again for her leg and arm. I explained that since she is not an established patient here, we would not be able to complete her paperwork. I told patient that she would need to establish care with a pcp, and they would need to assess her first to confidently be able to complete any paperwork.  Patient plans to reach out to her heart doctor for assistance, and call us back if she wishes to establish care. Informed the patient that our Providers that are accepting new patients are scheduled out until September and forward.

## 2020-12-27 NOTE — Telephone Encounter (Signed)
Negative.  Will not be accepting her as a new patient.  Thanks.

## 2020-12-27 NOTE — Telephone Encounter (Signed)
   Patient is wanting to establish care. She had a physical with you on 06-29-19. She said that she has  FMLA paperwork that is due 01-03-21. Please advise

## 2020-12-28 ENCOUNTER — Ambulatory Visit (INDEPENDENT_AMBULATORY_CARE_PROVIDER_SITE_OTHER): Payer: BC Managed Care – PPO | Admitting: Physician Assistant

## 2020-12-28 ENCOUNTER — Other Ambulatory Visit: Payer: Self-pay

## 2020-12-28 ENCOUNTER — Encounter: Payer: Self-pay | Admitting: Physician Assistant

## 2020-12-28 VITALS — BP 128/70 | HR 86 | Ht 66.0 in | Wt 185.6 lb

## 2020-12-28 DIAGNOSIS — M79602 Pain in left arm: Secondary | ICD-10-CM | POA: Diagnosis not present

## 2020-12-28 DIAGNOSIS — I471 Supraventricular tachycardia, unspecified: Secondary | ICD-10-CM

## 2020-12-28 DIAGNOSIS — R0789 Other chest pain: Secondary | ICD-10-CM | POA: Diagnosis not present

## 2020-12-28 NOTE — Patient Instructions (Signed)

## 2020-12-28 NOTE — Progress Notes (Signed)
Cardiology Office Note Date:  12/28/2020  Patient ID:  Ebony [name], Ebony [name] 08-Nov-1980, MRN [id number] PCP:  Patient, No Pcp Per (Inactive)  Electrophysiologist: Dr. Elberta Fortis    Chief Complaint:  6 mo, L chest/arm pain  History of Present Illness: Ebony [name] is a 40 y.o. female with history of  immune thrombocytopenia during pregnancy, HTN, SVT.  SVT hx is about 4-5 years, initially and for years supplemental O2 by EMS has prompted spontaneous conversion, once though did require adenosine with conversion. She is quite symptomatic with her SVT with CP, SOB.  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 Elberta Fortis Dec 2021, at thattime since the increase in BB dose had not had further tachycardia Instructed to use an additional dose of her Toprol PRN for palps.  TODAY She comes today with concerns of L chest and shoulder pain. This is a somewhat chronic nagging pain that has gotten worse in the last 3 weeks,  States she has been twice to e ER for this and came to follow up concerned may be a heart pain  The pain is primarily shoulder and very much is worse with a rotation/pronation of her arm/shoulder and winces in pain and grabs her chest. This is the pain and is definitely exacerbated by this particular movement though is constantly there as well. As she sits and talks with me she has her shoulder drawn upwards towards her ear and holds her L arm in support, states it seems to help.  No CP otherwise No near syncope or syncope No SVTs, palpitations  She mentions when in bed at night specifically her L lower leg feels numb, not otherwise, this is also not new. 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) 06/14/2014   Neuromuscular disorder (HCC)    PONV (postoperative nausea and vomiting)    PSVT (paroxysmal supraventricular tachycardia) (HCC)     Past Surgical History:  Procedure Laterality Date   CESAREAN SECTION  12/2008   CESAREAN SECTION N/A 08/24/2012   Procedure: CESAREAN SECTION;  Surgeon: Mickel Baas, MD;  Location: [facility];  Service: Obstetrics;  Laterality: N/A;   CESAREAN SECTION N/A 07/29/2014   Procedure: CESAREAN SECTION;  Surgeon: Essie Hart, MD;  Location: [facility];  Service: Obstetrics;  Laterality: N/A;   CESAREAN SECTION     WISDOM TOOTH EXTRACTION      Current Outpatient Medications  Medication Sig Dispense Refill   acetaminophen (TYLENOL) 325 MG tablet Take 325-650 mg by mouth every 6 (six) hours as needed for moderate pain.     ibuprofen (ADVIL) 400 MG tablet Take 400 mg by mouth every 6 (six) hours as needed.     metoprolol succinate (TOPROL XL) 25 MG 24 hr tablet Take 1 tablet (25 mg total) by mouth daily. 90 tablet 1   No current facility-administered medications for this visit.    Allergies:   Patient has no known allergies.   Social History:  The patient  reports that she has never smoked. She has never used smokeless tobacco. She reports that she does not drink alcohol and does  not use drugs.   Family History:  The patient's family history includes Stroke in her mother.  ROS:  Please see the history of present illness.    All other systems are reviewed and otherwise negative.   PHYSICAL EXAM:  VS:  BP 128/70   Pulse 86   Ht 5\' 6"  (1.676 m)   Wt 185 lb 9.6 oz (84.2 kg)   LMP 12/10/2020   SpO2 98%   BMI 29.96 kg/m  BMI: Body mass index is 29.96 kg/m. Well nourished, well developed, in no acute distress HEENT: normocephalic, atraumatic Neck: no JVD, carotid bruits or masses Cardiac:  RRR; no significant murmurs, no rubs, or gallops Lungs:  CTA b/l, no wheezing, rhonchi or  rales Abd: soft, nontender MS: no deformity or atrophy Ext: no edema Skin: warm and dry, no rash Neuro:  No gross deficits appreciated Psych: euthymic mood, full affect   EKG:  Done today and reviewed by myself shows  SR 77bpm, no ST/T changes Unchanged from 6/15 and older EKgs as well  TTE 02/01/20  1. Left ventricular ejection fraction, by estimation, is 60 to 65%. The  left ventricle has normal function. The left ventricle has no regional  wall motion abnormalities. Left ventricular diastolic parameters were  normal.   2. Right ventricular systolic function is normal. The right ventricular  size is normal. There is normal pulmonary artery systolic pressure.   3. The mitral valve is normal in structure. Trivial mitral valve  regurgitation. No evidence of mitral stenosis.   4. The aortic valve is normal in structure. Aortic valve regurgitation is  not visualized. No aortic stenosis is present.   5. The inferior vena cava is normal in size with greater than 50%  respiratory variability, suggesting right atrial pressure of 3 mmHg.  Recent Labs: 01/18/2020: Magnesium 1.8; TSH 1.102 06/29/2020: ALT 40 12/13/2020: BUN 12; Creatinine, Ser 0.63; Hemoglobin 12.5; Platelets 153; Potassium 3.9; Sodium 140  No results found for requested labs within last 8760 hours.   Estimated Creatinine Clearance: 101.2 mL/min (by C-G formula based on SCr of 0.63 mg/dL).   Wt Readings from Last 3 Encounters:  12/28/20 185 lb 9.6 oz (84.2 kg)  12/13/20 184 lb (83.5 kg)  07/04/20 181 lb (82.1 kg)     Other studies reviewed: Additional studies/records reviewed today include: summarized above  ASSESSMENT AND PLAN:  SVT maintained on BB No recurrent palpitations  2. HTN Looks good  3. 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  The patient did not have any concerns regarding medicines.  5mo, PA-C 12/28/2020 2:45 PM     [facility]  [address] [place] [address] [phone] (office)  [phone] (fax)

## 2021-01-09 DIAGNOSIS — M545 Low back pain, unspecified: Secondary | ICD-10-CM | POA: Diagnosis not present

## 2021-02-05 ENCOUNTER — Other Ambulatory Visit: Payer: Self-pay | Admitting: Cardiology

## 2021-02-10 DIAGNOSIS — U071 COVID-19: Secondary | ICD-10-CM | POA: Diagnosis not present

## 2021-04-06 ENCOUNTER — Emergency Department (HOSPITAL_COMMUNITY)
Admission: EM | Admit: 2021-04-06 | Discharge: 2021-04-06 | Disposition: A | Discharge status: Home / Self Care | Payer: BC Managed Care – PPO | Attending: Emergency Medicine | Admitting: Emergency Medicine

## 2021-04-06 ENCOUNTER — Other Ambulatory Visit: Payer: Self-pay

## 2021-04-06 DIAGNOSIS — I471 Supraventricular tachycardia: Secondary | ICD-10-CM | POA: Diagnosis not present

## 2021-04-06 DIAGNOSIS — I499 Cardiac arrhythmia, unspecified: Secondary | ICD-10-CM | POA: Diagnosis not present

## 2021-04-06 DIAGNOSIS — I10 Essential (primary) hypertension: Secondary | ICD-10-CM | POA: Diagnosis not present

## 2021-04-06 DIAGNOSIS — R000 Tachycardia, unspecified: Secondary | ICD-10-CM | POA: Diagnosis not present

## 2021-04-06 LAB — BASIC METABOLIC PANEL
Anion gap: 8 (ref 5–15)
BUN: 11 mg/dL (ref 6–20)
CO2: 21 mmol/L — ABNORMAL LOW (ref 22–32)
Calcium: 8.6 mg/dL — ABNORMAL LOW (ref 8.9–10.3)
Chloride: 110 mmol/L (ref 98–111)
Creatinine, Ser: 0.72 mg/dL (ref 0.44–1.00)
GFR, Estimated: >60 mL/min (ref >60)
Glucose, Bld: 99 mg/dL (ref 70–99)
Potassium: 3.2 mmol/L — ABNORMAL LOW (ref 3.5–5.1)
Sodium: 139 mmol/L (ref 135–145)

## 2021-04-06 LAB — CBC WITH DIFFERENTIAL/PLATELET
Abs Immature Granulocytes: 0.02 10*3/uL (ref 0.00–0.07)
Basophils Absolute: 0 10*3/uL (ref 0.0–0.1)
Basophils Relative: 1 %
Eosinophils Absolute: 0 10*3/uL (ref 0.0–0.5)
Eosinophils Relative: 0 %
HCT: 32.2 % — ABNORMAL LOW (ref 36.0–46.0)
Hemoglobin: 10.2 g/dL — ABNORMAL LOW (ref 12.0–15.0)
Immature Granulocytes: 0 %
Lymphocytes Relative: 21 %
Lymphs Abs: 1.4 10*3/uL (ref 0.7–4.0)
MCH: 26.7 pg (ref 26.0–34.0)
MCHC: 31.7 g/dL (ref 30.0–36.0)
MCV: 84.3 fL (ref 80.0–100.0)
Monocytes Absolute: 0.4 10*3/uL (ref 0.1–1.0)
Monocytes Relative: 5 %
Neutro Abs: 5 10*3/uL (ref 1.7–7.7)
Neutrophils Relative %: 73 %
Platelets: 120 10*3/uL — ABNORMAL LOW (ref 150–400)
RBC: 3.82 MIL/uL — ABNORMAL LOW (ref 3.87–5.11)
RDW: 13.2 % (ref 11.5–15.5)
WBC: 6.8 10*3/uL (ref 4.0–10.5)
nRBC: 0 % (ref 0.0–0.2)

## 2021-04-06 LAB — TROPONIN I (HIGH SENSITIVITY): Troponin I (High Sensitivity): 4 ng/L (ref <18)

## 2021-04-06 MED ORDER — SODIUM CHLORIDE 0.9 % IV BOLUS
500.0000 mL | Freq: Once | INTRAVENOUS | Status: AC
  Administered 2021-04-06: 500 mL via INTRAVENOUS

## 2021-04-06 MED ORDER — POTASSIUM CHLORIDE CRYS ER 20 MEQ PO TBCR
40.0000 meq | EXTENDED_RELEASE_TABLET | Freq: Once | ORAL | Status: AC
  Administered 2021-04-06: 40 meq via ORAL
  Filled 2021-04-06: qty 2

## 2021-04-06 NOTE — ED Triage Notes (Signed)
Pt arrived via [facility] from home. Per EMS, pt called d/t dizziness and SOB stating she believes she has a fast HR again. Pt has known hx of SVT and takes metoprolol. EMS reports they administered 6mg  adenosine and 1 L NS bolus which converted rhythm to NSR. Pt arrived to ED caox4 in NSR, stating she feels minimal chest heaviness at this time.   EMS VS Initial HR 180 , 90 on arrival Initial BP 168/110, 130/70 on arrival SpO2 100%

## 2021-04-06 NOTE — Discharge Instructions (Addendum)
Return for any problem.   Follow-up with her cardiologist as instructed.

## 2021-04-06 NOTE — ED Provider Notes (Signed)
[facility] EMERGENCY DEPARTMENT Provider Note   CSN: [id number] Arrival date & time: 04/06/21  1900     History Chief Complaint  Patient presents with   Tachycardia    Ebony [name] is a 40 y.o. female.  40 year old female with prior medical history as detailed below presents for evaluation.  Patient with longstanding history of SVT.  She is currently on metoprolol.  She reports feeling her heart rate increased earlier today.  She took an extra dose of metoprolol without improvement.  She then called EMS.  EMS administered 6 mg of adenosine with conversion of likely SVT to normal sinus rhythm.  Upon arrival to the ED she is now comfortable.  She is without current complaint.  The history is provided by the patient.  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) 06/14/2014   Neuromuscular disorder (HCC)    PONV (postoperative nausea and vomiting)    PSVT (paroxysmal supraventricular tachycardia) (HCC)     Patient Active Problem List   Diagnosis Date Noted   History of paroxysmal supraventricular tachycardia 06/29/2020   Anxiety 06/29/2020   Overweight 06/29/2020    Past Surgical History:  Procedure Laterality Date   CESAREAN SECTION  12/2008   CESAREAN SECTION N/A 08/24/2012   Procedure: CESAREAN SECTION;  Surgeon: Mickel Baas, MD;  Location: [facility];  Service: Obstetrics;  Laterality: N/A;   CESAREAN SECTION N/A 07/29/2014   Procedure: CESAREAN SECTION;  Surgeon: Essie Hart, MD;  Location: [facility];  Service: Obstetrics;  Laterality: N/A;   CESAREAN SECTION     WISDOM TOOTH EXTRACTION       OB History     Gravida  7    Para  6   Term  6   Preterm  0   AB  0   Living  2      SAB  0   IAB  0   Ectopic  0   Multiple      Live Births  2           Family History  Problem Relation Age of Onset   Stroke Mother     Social History   Tobacco Use   Smoking status: Never   Smokeless tobacco: Never  Substance Use Topics   Alcohol use: No   Drug use: No    Home Medications Prior to Admission medications   Medication Sig Start Date End Date Taking? Authorizing Provider  acetaminophen (TYLENOL) 325 MG tablet Take 325-650 mg by mouth every 6 (six) hours as needed for moderate pain.    [provider]  ibuprofen (ADVIL) 400 MG tablet Take 400 mg by mouth every 6 (six) hours as needed.    [provider]  metoprolol succinate (TOPROL-XL) 25 MG 24 hr tablet TAKE 1 TABLET(25 MG) BY MOUTH DAILY 02/05/21   Camnitz, Andree Coss, MD    Allergies    Patient has no known allergies.  Review of Systems   Review of Systems  All other systems reviewed and are negative.  Physical Exam Updated Vital Signs BP 127/77 (BP Location: Right Arm)  Pulse 80   Temp 98.1 F (36.7 C) (Oral)   Resp 15   Ht 5\' 6"  (1.676 m)   Wt 81.6 kg   SpO2 100%   BMI 29.05 kg/m   Physical Exam Vitals and nursing note reviewed.  Constitutional:      General: She is not in acute distress.    Appearance: Normal appearance. She is well-developed.  HENT:     Head: Normocephalic and atraumatic.  Eyes:     Conjunctiva/sclera: Conjunctivae normal.     Pupils: Pupils are equal, round, and reactive to light.  Cardiovascular:     Rate and Rhythm: Normal rate and regular rhythm.     Heart sounds: Normal heart sounds.  Pulmonary:     Effort: Pulmonary effort is normal. No respiratory distress.     Breath sounds: Normal breath sounds.  Abdominal:     General: There is no distension.     Palpations: Abdomen is soft.     Tenderness: There is no abdominal tenderness.  Musculoskeletal:         General: No deformity. Normal range of motion.     Cervical back: Normal range of motion and neck supple.  Skin:    General: Skin is warm and dry.  Neurological:     General: No focal deficit present.     Mental Status: She is alert and oriented to person, place, and time.    ED Results / Procedures / Treatments   Labs (all labs ordered are listed, but only abnormal results are displayed) Labs Reviewed  BASIC METABOLIC PANEL - Abnormal; Notable for the following components:      Result Value   Potassium 3.2 (*)    CO2 21 (*)    Calcium 8.6 (*)    All other components within normal limits  CBC WITH DIFFERENTIAL/PLATELET - Abnormal; Notable for the following components:   RBC 3.82 (*)    Hemoglobin 10.2 (*)    HCT 32.2 (*)    Platelets 120 (*)    All other components within normal limits  TROPONIN I (HIGH SENSITIVITY)  TROPONIN I (HIGH SENSITIVITY)    EKG EKG Interpretation  Date/Time:  Friday April 06 2021 19:10:23 EDT Ventricular Rate:  84 PR Interval:  143 QRS Duration: 85 QT Interval:  364 QTC Calculation: 431 R Axis:   47 Text Interpretation: Sinus rhythm Low voltage, precordial leads Confirmed by [id number] [phone]) on 04/06/2021 7:11:41 PM  Radiology No results found.  Procedures Procedures   Medications Ordered in ED Medications  sodium chloride 0.9 % bolus 500 mL (0 mLs Intravenous Stopped 04/06/21 2031)  potassium chloride SA (KLOR-CON) CR tablet 40 mEq (40 mEq Oral Given 04/06/21 2030)    ED Course  I have reviewed the triage vital signs and the nursing notes.  Pertinent labs & imaging results that were available during my care of the patient were reviewed by me and considered in my medical decision making (see chart for details).    MDM Rules/Calculators/A&P                           MDM  MSE complete  Ebony [name] was evaluated in Emergency Department on 04/06/2021 for the symptoms described in the history of present illness. She was  evaluated in the context of the global COVID-19 pandemic, which necessitated consideration that the patient might be at risk for infection with the SARS-CoV-2 virus that causes COVID-19. Institutional protocols and algorithms  that pertain to the evaluation of patients at risk for COVID-19 are in a state of rapid change based on information released by regulatory bodies including the CDC and federal and state organizations. These policies and algorithms were followed during the patient's care in the ED.   Patient with longstanding history of prior SVT presents with SVT.  Patient's SVT was corrected with 6 mg of adenosine administered by EMS.  She is in normal sinus rhythm in the ED.   Screening labs obtained are without significant abnormality other than mild decrease in potassium.  Patient is already established with [facility].  She understands need for close follow-up with same.  Strict return precautions given and understood.  Importance of close follow-up is repeatedly stressed. Final Clinical Impression(s) / ED Diagnoses Final diagnoses:  SVT (supraventricular tachycardia) (HCC)    Rx / DC Orders ED Discharge Orders     None        Wynetta Fines, MD 04/06/21 2136

## 2021-04-10 ENCOUNTER — Other Ambulatory Visit: Payer: Self-pay

## 2021-04-10 ENCOUNTER — Ambulatory Visit (INDEPENDENT_AMBULATORY_CARE_PROVIDER_SITE_OTHER): Payer: BC Managed Care – PPO | Admitting: Student

## 2021-04-10 ENCOUNTER — Encounter: Payer: Self-pay | Admitting: Student

## 2021-04-10 VITALS — BP 110/72 | HR 77 | Ht 66.0 in | Wt 185.0 lb

## 2021-04-10 DIAGNOSIS — I471 Supraventricular tachycardia: Secondary | ICD-10-CM | POA: Diagnosis not present

## 2021-04-10 DIAGNOSIS — E876 Hypokalemia: Secondary | ICD-10-CM

## 2021-04-10 LAB — BASIC METABOLIC PANEL
BUN/Creatinine Ratio: 17 (ref 9–23)
BUN: 11 mg/dL (ref 6–24)
CO2: 24 mmol/L (ref 20–29)
Calcium: 9.5 mg/dL (ref 8.7–10.2)
Chloride: 100 mmol/L (ref 96–106)
Creatinine, Ser: 0.65 mg/dL (ref 0.57–1.00)
Glucose: 91 mg/dL (ref 70–99)
Potassium: 4.6 mmol/L (ref 3.5–5.2)
Sodium: 135 mmol/L (ref 134–144)
eGFR: 113 mL/min/{1.73_m2} (ref >59)

## 2021-04-10 MED ORDER — DILTIAZEM HCL 30 MG PO TABS: ORAL_TABLET | ORAL | 0 refills | Status: DC

## 2021-04-10 MED ORDER — METOPROLOL SUCCINATE ER 50 MG PO TB24: 50.0000 mg | ORAL_TABLET | Freq: Every day | ORAL | 3 refills | Status: DC

## 2021-04-10 NOTE — Patient Instructions (Addendum)
Medication Instructions:  Your physician has recommended you make the following change in your medication:   INCREASE the Metoprolol to 50 mg taking at bedtime  START Diltiazem 30 mg taking only as needed for fast hear rate  *If you need a refill on your cardiac medications before your next appointment, please call your pharmacy*   Lab Work: TODAY:  BMET  If you have labs (blood work) drawn today and your tests are completely normal, you will receive your results only by: MyChart Message (if you have MyChart) OR A paper copy in the mail If you have any lab test that is abnormal or we need to change your treatment, we will call you to review the results.   Testing/Procedures: None ordered   Follow-Up: At [facility], you and your health needs are our priority.  As part of our continuing mission to provide you with exceptional heart care, we have created designated Provider Care Teams.  These Care Teams include your primary Cardiologist (physician) and Advanced Practice Providers (APPs -  Physician Assistants and Nurse Practitioners) who all work together to provide you with the care you need, when you need it.  We recommend signing up for the patient portal called "MyChart".  Sign up information is provided on this After Visit Summary.  MyChart is used to connect with patients for Virtual Visits (Telemedicine).  Patients are able to view lab/test results, encounter notes, upcoming appointments, etc.  Non-urgent messages can be sent to your provider as well.   To learn more about what you can do with MyChart, go to [email or web address].    Your next appointment:   6 month(s)  The format for your next appointment:   In Person  Provider:   Loman Brooklyn, MD   Other Instructions

## 2021-04-10 NOTE — Progress Notes (Signed)
PCP:  Patient, No Pcp Per (Inactive) Primary Cardiologist: None Electrophysiologist: Will Jorja Loa, MD   Ebony [name] is a 40 y.o. female seen today for Will Jorja Loa, MD for acute visit due to palpitations .  Since last being seen in our clinic the patient reports doing overall OK.  She had to call EMS 04/06/2021 for breakthrough palpitations. These occurred while she was cooking.  This occurred on the last day of an especially heavy period.  EMS ending up using adenosine to abort her SVT. Strip not available. She has required adenosine once before. She has tried extra Toprol with no relief. She was not sure about which vagal maneuvers to try. Last episode of SVT was 6 months ago (short), last severe episode requiring Adenosine was >1 year ago.   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) 06/14/2014   Neuromuscular disorder (HCC)    PONV (postoperative nausea and vomiting)    PSVT (paroxysmal supraventricular tachycardia) (HCC)    Past Surgical History:  Procedure Laterality Date   CESAREAN SECTION  12/2008   CESAREAN SECTION N/A 08/24/2012   Procedure: CESAREAN SECTION;  Surgeon: Mickel Baas, MD;  Location: [facility];  Service: Obstetrics;  Laterality: N/A;   CESAREAN SECTION N/A 07/29/2014   Procedure: CESAREAN SECTION;  Surgeon: Essie Hart, MD;  Location: [facility];  Service: Obstetrics;  Laterality: N/A;   CESAREAN SECTION     WISDOM TOOTH EXTRACTION      Current Outpatient Medications  Medication Sig Dispense Refill   acetaminophen (TYLENOL) 325 MG tablet Take 325-650 mg by mouth every 6 (six) hours as needed for moderate pain.     ibuprofen (ADVIL) 400 MG tablet Take 400 mg by mouth every 6 (six) hours as needed.     metoprolol succinate (TOPROL-XL) 25 MG 24 hr tablet TAKE 1 TABLET(25 MG) BY MOUTH  DAILY 90 tablet 3   No current facility-administered medications for this visit.    No Known Allergies  Social History   Socioeconomic History   Marital status: Married    Spouse name: Not on file   Number of children: Not on file   Years of education: Not on file   Highest education level: Not on file  Occupational History   Not on file  Tobacco Use   Smoking status: Never   Smokeless tobacco: Never  Substance and Sexual Activity   Alcohol use: No   Drug use: No   Sexual activity: Yes    Birth control/protection: None    Comment: pregnant  Other Topics Concern   Not on file  Social History Narrative   Lives locally with husband.     Social Determinants of Health   Financial Resource Strain: Not on file  Food Insecurity: Not on file  Transportation Needs: Not on file  Physical Activity: Not on file  Stress: Not on file  Social Connections: Not on file  Intimate Partner Violence: Not on file     Review of Systems: All other systems reviewed and are otherwise negative except as noted above.  Physical Exam: Vitals:   04/10/21 0937  BP: 110/72  Pulse: 77  SpO2: 93%  Weight: 185 lb (83.9 kg)  Height: 5\' 6"  (1.676 m)    GEN- The patient is well appearing,  alert and oriented x 3 today.   HEENT: normocephalic, atraumatic; sclera clear, conjunctiva pink; hearing intact; oropharynx clear; neck supple, no JVP Lymph- no cervical lymphadenopathy Lungs- Clear to ausculation bilaterally, normal work of breathing.  No wheezes, rales, rhonchi Heart- Regular rate and rhythm, no murmurs, rubs or gallops, PMI not laterally displaced GI- soft, non-tender, non-distended, bowel sounds present, no hepatosplenomegaly Extremities- no clubbing, cyanosis, or edema; DP/PT/radial pulses 2+ bilaterally MS- no significant deformity or atrophy Skin- warm and dry, no rash or lesion Psych- euthymic mood, full affect Neuro- strength and sensation are intact  EKG is not ordered.    Additional studies reviewed include: Previous EP office notes.   Assessment and Plan:  1. SVT Intermittent, happens 1-2 times a year.  Recent episode in setting of finishing a heavy period. Anemia noted on labs as well as hypokalemia Discussed med options and in shared decision making we will: INCREASE Toprol to 50 mg qhs.  Add Diltiazem 30 mg prn for tachy-palpitations.  She is procedure adverse, and wants to save ablation consideration for "last option"  2. Hypokalemia K 3.2 on arrival to Ed.  Her K was also low at 3.4 in 12/2019, the last time she required adenosine.  BMET today. Consider low dose K supp vs K replete diet.   Graciella Freer, PA-C  04/10/21 9:48 AM

## 2021-05-03 DIAGNOSIS — Z1159 Encounter for screening for other viral diseases: Secondary | ICD-10-CM | POA: Diagnosis not present

## 2021-05-03 DIAGNOSIS — Z0000 Encounter for general adult medical examination without abnormal findings: Secondary | ICD-10-CM | POA: Diagnosis not present

## 2021-05-03 DIAGNOSIS — Z131 Encounter for screening for diabetes mellitus: Secondary | ICD-10-CM | POA: Diagnosis not present

## 2021-05-03 DIAGNOSIS — R5383 Other fatigue: Secondary | ICD-10-CM | POA: Diagnosis not present

## 2021-05-11 DIAGNOSIS — I471 Supraventricular tachycardia: Secondary | ICD-10-CM | POA: Diagnosis not present

## 2021-05-11 DIAGNOSIS — D539 Nutritional anemia, unspecified: Secondary | ICD-10-CM | POA: Diagnosis not present

## 2021-05-26 ENCOUNTER — Other Ambulatory Visit: Payer: Self-pay

## 2021-05-26 ENCOUNTER — Emergency Department (HOSPITAL_BASED_OUTPATIENT_CLINIC_OR_DEPARTMENT_OTHER)
Admission: EM | Admit: 2021-05-26 | Discharge: 2021-05-26 | Disposition: A | Discharge status: Home / Self Care | Payer: BC Managed Care – PPO | Attending: Emergency Medicine | Admitting: Emergency Medicine

## 2021-05-26 ENCOUNTER — Emergency Department (HOSPITAL_BASED_OUTPATIENT_CLINIC_OR_DEPARTMENT_OTHER): Payer: BC Managed Care – PPO

## 2021-05-26 ENCOUNTER — Encounter (HOSPITAL_BASED_OUTPATIENT_CLINIC_OR_DEPARTMENT_OTHER): Payer: Self-pay | Admitting: Emergency Medicine

## 2021-05-26 DIAGNOSIS — Z20822 Contact with and (suspected) exposure to covid-19: Secondary | ICD-10-CM | POA: Insufficient documentation

## 2021-05-26 DIAGNOSIS — J029 Acute pharyngitis, unspecified: Secondary | ICD-10-CM | POA: Diagnosis not present

## 2021-05-26 DIAGNOSIS — R059 Cough, unspecified: Secondary | ICD-10-CM | POA: Diagnosis not present

## 2021-05-26 LAB — RESP PANEL BY RT-PCR (FLU A&B, COVID) ARPGX2
Influenza A by PCR: NEGATIVE
Influenza B by PCR: NEGATIVE
SARS Coronavirus 2 by RT PCR: NEGATIVE

## 2021-05-26 LAB — GROUP A STREP BY PCR: Group A Strep by PCR: NOT DETECTED

## 2021-05-26 NOTE — ED Provider Notes (Signed)
[facility] EMERGENCY DEPT Provider Note   CSN: [id number] Arrival date & time: 05/26/21  1543     History Chief Complaint  Patient presents with   Cough    Ebony [name] is a 40 y.o. female.  She is here with 2 days of sore throat, hoarse voice, cough.  No fever.  Nonproductive cough.  No sick contacts or recent travel.  Non-smoker.  She has tried nothing for it.  The history is provided by the patient.  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/14/2014   Neuromuscular disorder ([place])    PONV (postoperative nausea and vomiting)    PSVT (paroxysmal supraventricular tachycardia) ([place])     Patient Active Problem List   Diagnosis Date Noted   History of paroxysmal supraventricular tachycardia 06/29/2020   Anxiety 06/29/2020   Overweight 06/29/2020    Past Surgical History:  Procedure Laterality Date   CESAREAN SECTION  12/2008   CESAREAN SECTION N/A 08/24/2012   Procedure: CESAREAN SECTION;  Surgeon: Sharene Butters, MD;  Location: [facility];  Service: Obstetrics;  Laterality: N/A;   CESAREAN SECTION N/A 07/29/2014   Procedure: CESAREAN SECTION;  Surgeon: Sanjuana Kava, MD;  Location: [facility];  Service: Obstetrics;  Laterality: N/A;   CESAREAN SECTION     WISDOM TOOTH EXTRACTION       OB History     Gravida  7   Para  6   Term  6   Preterm  0   AB   0   Living  2      SAB  0   IAB  0   Ectopic  0   Multiple      Live Births  2           Family History  Problem Relation Age of Onset   Stroke Mother     Social History   Tobacco Use   Smoking status: Never   Smokeless tobacco: Never  Substance Use Topics   Alcohol use: No   Drug use: No    Home Medications Prior to Admission medications   Medication Sig Start Date End Date Taking? Authorizing Provider  acetaminophen (TYLENOL) 325 MG tablet Take 325-650 mg by mouth every 6 (six) hours as needed for moderate pain.    [provider]  diltiazem (CARDIZEM) 30 MG tablet TAKE 1 TABLET BY MOUTH DAILY ONLY AS NEEDED FOR FAST HEART RATE 04/10/21   Shirley Friar, PA-C  ibuprofen (ADVIL) 400 MG tablet Take 400 mg by mouth every 6 (six) hours as needed.    [provider]  metoprolol succinate (TOPROL-XL) 50 MG 24 hr tablet Take 1 tablet (50 mg total) by mouth daily. Take with or immediately following a meal. 04/10/21 07/09/21  Tillery, Satira Mccallum, PA-C    Allergies  Patient has no known allergies.  Review of Systems   Review of Systems  Constitutional:  Negative for fever.  HENT:  Positive for sore throat and voice change. Negative for sneezing.   Eyes:  Negative for visual disturbance.  Respiratory:  Positive for cough. Negative for shortness of breath and wheezing.   Cardiovascular:  Negative for chest pain.  Gastrointestinal:  Negative for abdominal pain.  Genitourinary:  Negative for dysuria.  Musculoskeletal:  Negative for myalgias.  Skin:  Negative for rash.  Neurological:  Negative for headaches.   Physical Exam Updated Vital Signs BP 134/74 (BP Location: Right Arm)   Pulse 74   Temp 98.3 F (36.8 C)   Resp 16   Ht 5\' 6"  (1.676 m)   Wt 82.1 kg   LMP 05/19/2021   SpO2 100%   BMI 29.21 kg/m   Physical Exam Vitals and nursing note reviewed.  Constitutional:      General: She is not in acute distress.     Appearance: Normal appearance. She is well-developed.  HENT:     Head: Normocephalic and atraumatic.     Right Ear: Tympanic membrane normal.     Left Ear: Tympanic membrane normal.     Nose: Nose normal.     Mouth/Throat:     Mouth: Mucous membranes are moist.     Pharynx: Posterior oropharyngeal erythema present. No oropharyngeal exudate.  Eyes:     Conjunctiva/sclera: Conjunctivae normal.  Cardiovascular:     Rate and Rhythm: Normal rate and regular rhythm.     Heart sounds: No murmur heard. Pulmonary:     Effort: Pulmonary effort is normal. No respiratory distress.     Breath sounds: Normal breath sounds.  Abdominal:     Palpations: Abdomen is soft.     Tenderness: There is no abdominal tenderness.  Musculoskeletal:        General: No swelling.     Cervical back: Neck supple.  Skin:    General: Skin is warm and dry.     Capillary Refill: Capillary refill takes less than 2 seconds.  Neurological:     General: No focal deficit present.     Mental Status: She is alert.  Psychiatric:        Mood and Affect: Mood normal.    ED Results / Procedures / Treatments   Labs (all labs ordered are listed, but only abnormal results are displayed) Labs Reviewed  RESP PANEL BY RT-PCR (FLU A&B, COVID) ARPGX2  GROUP A STREP BY PCR    EKG None  Radiology DG Chest Port 1 View  Result Date: 05/26/2021 CLINICAL DATA:  Cough EXAM: PORTABLE CHEST 1 VIEW COMPARISON:  Portable exam 1750 hours compared to 12/13/2020 FINDINGS: Normal heart size, mediastinal contours, and pulmonary vascularity. Lungs clear. No pleural effusion or pneumothorax. Bones unremarkable. IMPRESSION: No acute abnormalities. Electronically Signed   By: Lavonia Dana M.D.   On: 05/26/2021 18:00    Procedures Procedures   Medications Ordered in ED Medications - No data to display  ED Course  I have reviewed the triage vital signs and the nursing notes.  Pertinent labs & imaging results that were available during  my care of the patient were reviewed by me and considered in my medical decision making (see chart for details).    MDM Rules/Calculators/A&P                          Ebony [name] was evaluated in  Emergency Department on 05/26/2021 for the symptoms described in the history of present illness. She was evaluated in the context of the global COVID-19 pandemic, which necessitated consideration that the patient might be at risk for infection with the SARS-CoV-2 virus that causes COVID-19. Institutional protocols and algorithms that pertain to the evaluation of patients at risk for COVID-19 are in a state of rapid change based on information released by regulatory bodies including the CDC and federal and state organizations. These policies and algorithms were followed during the patient's care in the ED.   Final Clinical Impression(s) / ED Diagnoses Final diagnoses:  Acute pharyngitis, unspecified etiology    Rx / DC Orders ED Discharge Orders     None        Terrilee Files, MD 05/27/21 [phone]

## 2021-05-26 NOTE — ED Triage Notes (Incomplete)
Pt arrives pov, ambulatory to triage, c/o hoarseness, sore throat, cough and nasal drainage x 2 days. Pt denies fever

## 2021-05-26 NOTE — ED Notes (Signed)
X-ray at bedside

## 2021-05-26 NOTE — Discharge Instructions (Addendum)
Your COVID and flu testing were negative, your strep test was negative, and your chest x-ray did not show any signs of pneumonia.  Please use Tylenol and ibuprofen for pain, drink plenty of fluids.  You can also try warm salt water gargles.  Return to the emergency department if any worsening or concerning symptoms

## 2021-06-02 ENCOUNTER — Other Ambulatory Visit: Payer: Self-pay

## 2021-06-02 ENCOUNTER — Encounter (HOSPITAL_BASED_OUTPATIENT_CLINIC_OR_DEPARTMENT_OTHER): Payer: Self-pay | Admitting: Emergency Medicine

## 2021-06-02 ENCOUNTER — Emergency Department (HOSPITAL_BASED_OUTPATIENT_CLINIC_OR_DEPARTMENT_OTHER)
Admission: EM | Admit: 2021-06-02 | Discharge: 2021-06-02 | Disposition: A | Discharge status: Home / Self Care | Payer: BC Managed Care – PPO | Attending: Emergency Medicine | Admitting: Emergency Medicine

## 2021-06-02 DIAGNOSIS — I10 Essential (primary) hypertension: Secondary | ICD-10-CM | POA: Insufficient documentation

## 2021-06-02 DIAGNOSIS — R059 Cough, unspecified: Secondary | ICD-10-CM | POA: Diagnosis not present

## 2021-06-02 DIAGNOSIS — Z79899 Other long term (current) drug therapy: Secondary | ICD-10-CM | POA: Diagnosis not present

## 2021-06-02 DIAGNOSIS — J40 Bronchitis, not specified as acute or chronic: Secondary | ICD-10-CM

## 2021-06-02 MED ORDER — AZITHROMYCIN 250 MG PO TABS: 250.0000 mg | ORAL_TABLET | Freq: Every day | ORAL | 0 refills | Status: AC

## 2021-06-02 MED ORDER — ALBUTEROL SULFATE HFA 108 (90 BASE) MCG/ACT IN AERS
2.0000 | INHALATION_SPRAY | RESPIRATORY_TRACT | Status: DC | PRN
  Administered 2021-06-02: 2 via RESPIRATORY_TRACT
  Filled 2021-06-02: qty 6.7

## 2021-06-02 NOTE — Discharge Instructions (Signed)
Please use albuterol 2 puffs up to every 4 hours Get your Zithromax prescription filled and take as prescribed Follow-up with your primary care doctor in the next week if not improved. If you are worsening at any time, as discussed, worsening shortness of breath fevers or not controlled medications or inability to tolerate fluids, please return for reevaluation

## 2021-06-02 NOTE — ED Triage Notes (Signed)
Pt returns, not feeling any better with resp complaints from last week,

## 2021-06-02 NOTE — ED Provider Notes (Signed)
[facility]PT Provider Note   CSN: [id number] Arrival date & time: 06/02/21  7829     History Chief Complaint  Patient presents with   Cough    Ebony [name] is a 41 y.o. female.  HPI  40 year old female history of SVT, presents today complaining of ongoing cough, congestion, and fevers.  She has had 9 days of symptoms.  She was seen here last week and at that time had COVID, flu, and strep test performed that were all negative.  Her sore throat has improved.  She has continued to have cough and congestion.  Her cough is productive of yellowish sputum.  She is also having discharge with discolored sputum.  She reports that she had a fever to 102 yesterday.  She took over-the-counter cough and cold medications today prior to coming in the had acetaminophen in them.  She is able to eat and drink without difficulty.  She has some mild dyspnea with coughing but is otherwise not short of breath.  She is not a smoker.  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) 06/14/2014   Neuromuscular disorder ([facility])    PONV (postoperative nausea and vomiting)    PSVT (paroxysmal supraventricular tachycardia) [facility])     Patient Active Problem List   Diagnosis Date Noted   History of paroxysmal supraventricular tachycardia 06/29/2020   Anxiety 06/29/2020   Overweight 06/29/2020    Past Surgical History:  Procedure Laterality Date   CESAREAN SECTION  12/2008   CESAREAN SECTION N/A 08/24/2012   Procedure: CESAREAN SECTION;  Surgeon: Mickel Baas, MD;  Location: [facility];  Service: Obstetrics;  Laterality: N/A;   CESAREAN SECTION N/A 07/29/2014   Procedure: CESAREAN SECTION;  Surgeon: Essie Hart, MD;  Location: [facility];  Service: Obstetrics;  Laterality: N/A;   CESAREAN SECTION     WISDOM TOOTH EXTRACTION        OB History     Gravida  7   Para  6   Term  6   Preterm  0   AB  0   Living  2      SAB  0   IAB  0   Ectopic  0   Multiple      Live Births  2           Family History  Problem Relation Age of Onset   Stroke Mother     Social History   Tobacco Use   Smoking status: Never   Smokeless tobacco: Never  Substance Use Topics   Alcohol use: No   Drug use: No    Home Medications Prior to Admission medications   Medication Sig Start Date End Date Taking? Authorizing Provider  azithromycin (ZITHROMAX) 250 MG tablet Take 1 tablet (250 mg total) by mouth daily. Take first 2 tablets together, then 1 every day until finished. 06/02/21  Yes Margarita Grizzle, MD  acetaminophen (TYLENOL) 325 MG tablet Take 325-650 mg by mouth every 6 (six) hours as needed for moderate pain.    [provider]  diltiazem (CARDIZEM) 30 MG tablet TAKE 1 TABLET BY MOUTH DAILY ONLY AS NEEDED FOR FAST HEART RATE 04/10/21   Graciella Freer, PA-C  ibuprofen (ADVIL) 400 MG tablet Take 400 mg by mouth every 6 (  six) hours as needed.    [provider]  metoprolol succinate (TOPROL-XL) 50 MG 24 hr tablet Take 1 tablet (50 mg total) by mouth daily. Take with or immediately following a meal. 04/10/21 07/09/21  Tillery, Satira Mccallum, PA-C    Allergies    Patient has no known allergies.  Review of Systems   Review of Systems  All other systems reviewed and are negative.  Physical Exam Updated Vital Signs BP 132/84   Pulse 85   Temp 98.2 F (36.8 C) (Oral)   Resp 20   LMP 05/19/2021   SpO2 100%   Physical Exam Vitals (Heart rate 82 temperature taken at 98.2 orally, normal respiratory rate at 16) and nursing note reviewed. Exam conducted with a chaperone present.  Constitutional:      Appearance: Normal appearance.  HENT:     Head: Normocephalic.     Right Ear: External ear normal.     Left Ear: External ear normal.     Nose: Nose normal.     Mouth/Throat:      Mouth: Mucous membranes are moist.     Pharynx: Oropharynx is clear. No oropharyngeal exudate.     Comments: Mild posterior pharyngeal erythema with no swelling Eyes:     Extraocular Movements: Extraocular movements intact.     Conjunctiva/sclera: Conjunctivae normal.     Pupils: Pupils are equal, round, and reactive to light.  Cardiovascular:     Rate and Rhythm: Normal rate and regular rhythm.  Pulmonary:     Effort: Pulmonary effort is normal.     Comments: Few expiratory wheezes left base Abdominal:     General: Bowel sounds are normal.     Palpations: Abdomen is soft.  Musculoskeletal:        General: Normal range of motion.     Cervical back: Normal range of motion.  Skin:    General: Skin is warm and dry.     Capillary Refill: Capillary refill takes less than 2 seconds.  Neurological:     General: No focal deficit present.     Mental Status: She is alert.  Psychiatric:        Mood and Affect: Mood normal.        Behavior: Behavior normal.    ED Results / Procedures / Treatments   Labs (all labs ordered are listed, but only abnormal results are displayed) Labs Reviewed - No data to display  EKG None  Radiology No results found.  Procedures Procedures   Medications Ordered in ED Medications  albuterol (VENTOLIN HFA) 108 (90 Base) MCG/ACT inhaler 2 puff (has no administration in time range)    ED Course  I have reviewed the triage vital signs and the nursing notes.  Pertinent labs & imaging results that were available during my care of the patient were reviewed by me and considered in my medical decision making (see chart for details).    MDM Rules/Calculators/A&P                          40 year old female with history of SVT otherwise healthy presents today with ongoing cough, nasal congestion, and fever.  She has some expiratory wheezes on exam.  She has no rhonchi and appears hemodynamically stable here in ED.  Plan albuterol and Zithromax.   Discussed return precautions and need for follow-up patient voices understanding. Final Clinical Impression(s) / ED Diagnoses Final diagnoses:  Bronchitis    Rx / DC Orders  ED Discharge Orders          Ordered    azithromycin (ZITHROMAX) 250 MG tablet  Daily        06/02/21 0916             Pattricia Boss, MD 06/02/21 [phone]

## 2021-06-02 NOTE — ED Notes (Signed)
Dc instructions reviewed with patient. Patient voiced understanding. Dc with belongings.  °

## 2021-06-06 DIAGNOSIS — Z20822 Contact with and (suspected) exposure to covid-19: Secondary | ICD-10-CM | POA: Diagnosis not present

## 2021-06-06 DIAGNOSIS — E611 Iron deficiency: Secondary | ICD-10-CM | POA: Diagnosis not present

## 2021-06-19 ENCOUNTER — Other Ambulatory Visit: Payer: Self-pay | Admitting: Student

## 2021-09-22 ENCOUNTER — Other Ambulatory Visit: Payer: Self-pay

## 2021-09-22 ENCOUNTER — Emergency Department (HOSPITAL_BASED_OUTPATIENT_CLINIC_OR_DEPARTMENT_OTHER)
Admission: EM | Admit: 2021-09-22 | Discharge: 2021-09-22 | Disposition: A | Discharge status: Home / Self Care | Payer: BC Managed Care – PPO | Attending: Emergency Medicine | Admitting: Emergency Medicine

## 2021-09-22 ENCOUNTER — Encounter (HOSPITAL_BASED_OUTPATIENT_CLINIC_OR_DEPARTMENT_OTHER): Payer: Self-pay | Admitting: Emergency Medicine

## 2021-09-22 DIAGNOSIS — Z79899 Other long term (current) drug therapy: Secondary | ICD-10-CM | POA: Diagnosis not present

## 2021-09-22 DIAGNOSIS — Z20822 Contact with and (suspected) exposure to covid-19: Secondary | ICD-10-CM | POA: Diagnosis not present

## 2021-09-22 DIAGNOSIS — J020 Streptococcal pharyngitis: Secondary | ICD-10-CM | POA: Diagnosis not present

## 2021-09-22 DIAGNOSIS — I10 Essential (primary) hypertension: Secondary | ICD-10-CM | POA: Diagnosis not present

## 2021-09-22 DIAGNOSIS — J029 Acute pharyngitis, unspecified: Secondary | ICD-10-CM | POA: Diagnosis present

## 2021-09-22 LAB — RESP PANEL BY RT-PCR (FLU A&B, COVID) ARPGX2
Influenza A by PCR: NEGATIVE
Influenza B by PCR: NEGATIVE
SARS Coronavirus 2 by RT PCR: NEGATIVE

## 2021-09-22 LAB — GROUP A STREP BY PCR: Group A Strep by PCR: DETECTED — AB

## 2021-09-22 MED ORDER — PENICILLIN G BENZATHINE 1200000 UNIT/2ML IM SUSY
1.2000 10*6.[IU] | PREFILLED_SYRINGE | Freq: Once | INTRAMUSCULAR | Status: AC
  Administered 2021-09-22: 1.2 10*6.[IU] via INTRAMUSCULAR
  Filled 2021-09-22: qty 2

## 2021-09-22 MED ORDER — DEXAMETHASONE 4 MG PO TABS
10.0000 mg | ORAL_TABLET | Freq: Once | ORAL | Status: AC
  Administered 2021-09-22: 10 mg via ORAL
  Filled 2021-09-22: qty 3

## 2021-09-22 MED ORDER — DEXAMETHASONE 1 MG/ML PO CONC: 10.0000 mg | Freq: Once | ORAL | Status: DC

## 2021-09-22 NOTE — ED Triage Notes (Signed)
Pt c/o sore throat, fever and weakness onset yesterday. Pt denies exposure to others with illness. ?

## 2021-09-22 NOTE — ED Provider Notes (Signed)
?[facility] ?Provider Note ? ? ?CSN: [id number] ?Arrival date & time: 09/22/21  1324 ? ?  ? ?History ? ?Chief Complaint  ?Patient presents with  ? Sore Throat  ? ? ?Ebony [name] is a 41 y.o. female. ? ?HPI ? ?41 year old female with a history of anemia, back pain, headache, hypertension, neuromuscular disorder, paroxysmal SVT, who presents to the emergency department today for evaluation of a sore throat that started yesterday.  Pain is constant and severe in nature and is worse with swallowing.  She had associated chills and subjective fevers.  She denies any associated rhinorrhea, congestion, cough or other URI symptoms. ? ?Home Medications ?Prior to Admission medications   ?Medication Sig Start Date End Date Taking? Authorizing Provider  ?acetaminophen (TYLENOL) 325 MG tablet Take 325-650 mg by mouth every 6 (six) hours as needed for moderate pain.    [provider]  ?azithromycin (ZITHROMAX) 250 MG tablet Take 1 tablet (250 mg total) by mouth daily. Take first 2 tablets together, then 1 every day until finished. 06/02/21   Margarita Grizzle, MD  ?diltiazem (CARDIZEM) 30 MG tablet TAKE 1 TABLET BY MOUTH DAILY AS NEEDED FOR FAST HEART RATE 06/19/21   Camnitz, Andree Coss, MD  ?ibuprofen (ADVIL) 400 MG tablet Take 400 mg by mouth every 6 (six) hours as needed.    [provider]  ?metoprolol succinate (TOPROL-XL) 50 MG 24 hr tablet Take 1 tablet (50 mg total) by mouth daily. Take with or immediately following a meal. 04/10/21 07/09/21  Tillery, Mariam Dollar, PA-C  ?   ? ?Allergies    ?Patient has no known allergies.   ? ?Review of Systems   ?Review of Systems ?See HPI for pertinent positives or negatives. ? ? ?Physical Exam ?Updated Vital Signs ?BP 119/88 (BP Location: Left Arm)   Pulse 78   Temp 99.1 ?F (37.3 ?C) (Oral)   Resp 16   Ht 5\' 5"  (1.651 m)   Wt 80.7 kg   LMP 09/14/2021   SpO2 100%   BMI 29.62 kg/m?  ?Physical Exam ?Vitals and nursing note reviewed.   ?Constitutional:   ?   General: She is not in acute distress. ?   Appearance: She is well-developed.  ?HENT:  ?   Head: Normocephalic and atraumatic.  ?   Mouth/Throat:  ?   Pharynx: Posterior oropharyngeal erythema present. No oropharyngeal exudate.  ?   Tonsils: Tonsillar abscess present. 2+ on the right. 2+ on the left.  ?Eyes:  ?   Conjunctiva/sclera: Conjunctivae normal.  ?Cardiovascular:  ?   Rate and Rhythm: Normal rate.  ?Pulmonary:  ?   Effort: Pulmonary effort is normal.  ?Musculoskeletal:     ?   General: Normal range of motion.  ?   Cervical back: Neck supple.  ?Skin: ?   General: Skin is warm and dry.  ?Neurological:  ?   Mental Status: She is alert.  ? ? ?ED Results / Procedures / Treatments   ?Labs ?(all labs ordered are listed, but only abnormal results are displayed) ?Labs Reviewed  ?GROUP A STREP BY PCR - Abnormal; Notable for the following components:  ?    Result Value  ? Group A Strep by PCR DETECTED (*)   ? All other components within normal limits  ?RESP PANEL BY RT-PCR (FLU A&B, COVID) ARPGX2  ? ? ?EKG ?None ? ?Radiology ?No results found. ? ?Procedures ?Procedures  ? ? ?Medications Ordered in ED ?Medications  ?penicillin g benzathine (BICILLIN LA) 1200000  UNIT/2ML injection 1.2 Million Units (has no administration in time range)  ?dexamethasone (DECADRON) tablet 10 mg (has no administration in time range)  ? ? ?ED Course/ Medical Decision Making/ A&P ?  ?                        ?Medical Decision Making ? ?Pt febrile with tonsillar exudate, cervical lymphadenopathy, & dysphagia; diagnosis of strep. Treated in the Ed with steroids and PCN IM.  Pt appears mildly dehydrated, discussed importance of water rehydration. Presentation non concerning for PTA or infxn spread to soft tissue. No trismus or uvula deviation. Specific return precautions discussed. Pt able to drink water in ED without difficulty with intact air way. Recommended PCP follow up.  ? ? ?Final Clinical Impression(s) / ED  Diagnoses ?Final diagnoses:  ?Strep pharyngitis  ? ? ?Rx / DC Orders ?ED Discharge Orders   ? ? None  ? ?  ? ? ?  ?Karrie Meres, PA-C ?09/22/21 1441 ? ?  ?Lorre Nick, MD ?09/23/21 1500 ? ?

## 2021-09-22 NOTE — ED Notes (Signed)
ED Provider at bedside. 

## 2021-09-22 NOTE — ED Notes (Signed)
EMT-P provided AVS using Teachback Method. Patient verbalizes understanding of Discharge Instructions. Opportunity for Questioning and Answers were provided by EMT-P. Patient Discharged from ED.  ? ?

## 2021-09-22 NOTE — Discharge Instructions (Signed)
Rotate tylenol and motrin for fevers and pain ? ?Please follow up with your primary care provider within 5-7 days for re-evaluation of your symptoms. If you do not have a primary care provider, information for a healthcare clinic has been provided for you to make arrangements for follow up care. Please return to the emergency department for any new or worsening symptoms. ? ?

## 2021-12-30 IMAGING — CR DG CHEST 1V PORT
1 series · 1 of 1 positions shown · non-contrast
Comparison: 03/19/2020

CLINICAL DATA: [id number] positivity with shortness of breath

EXAM:
PORTABLE CHEST 1 VIEW

[AP]
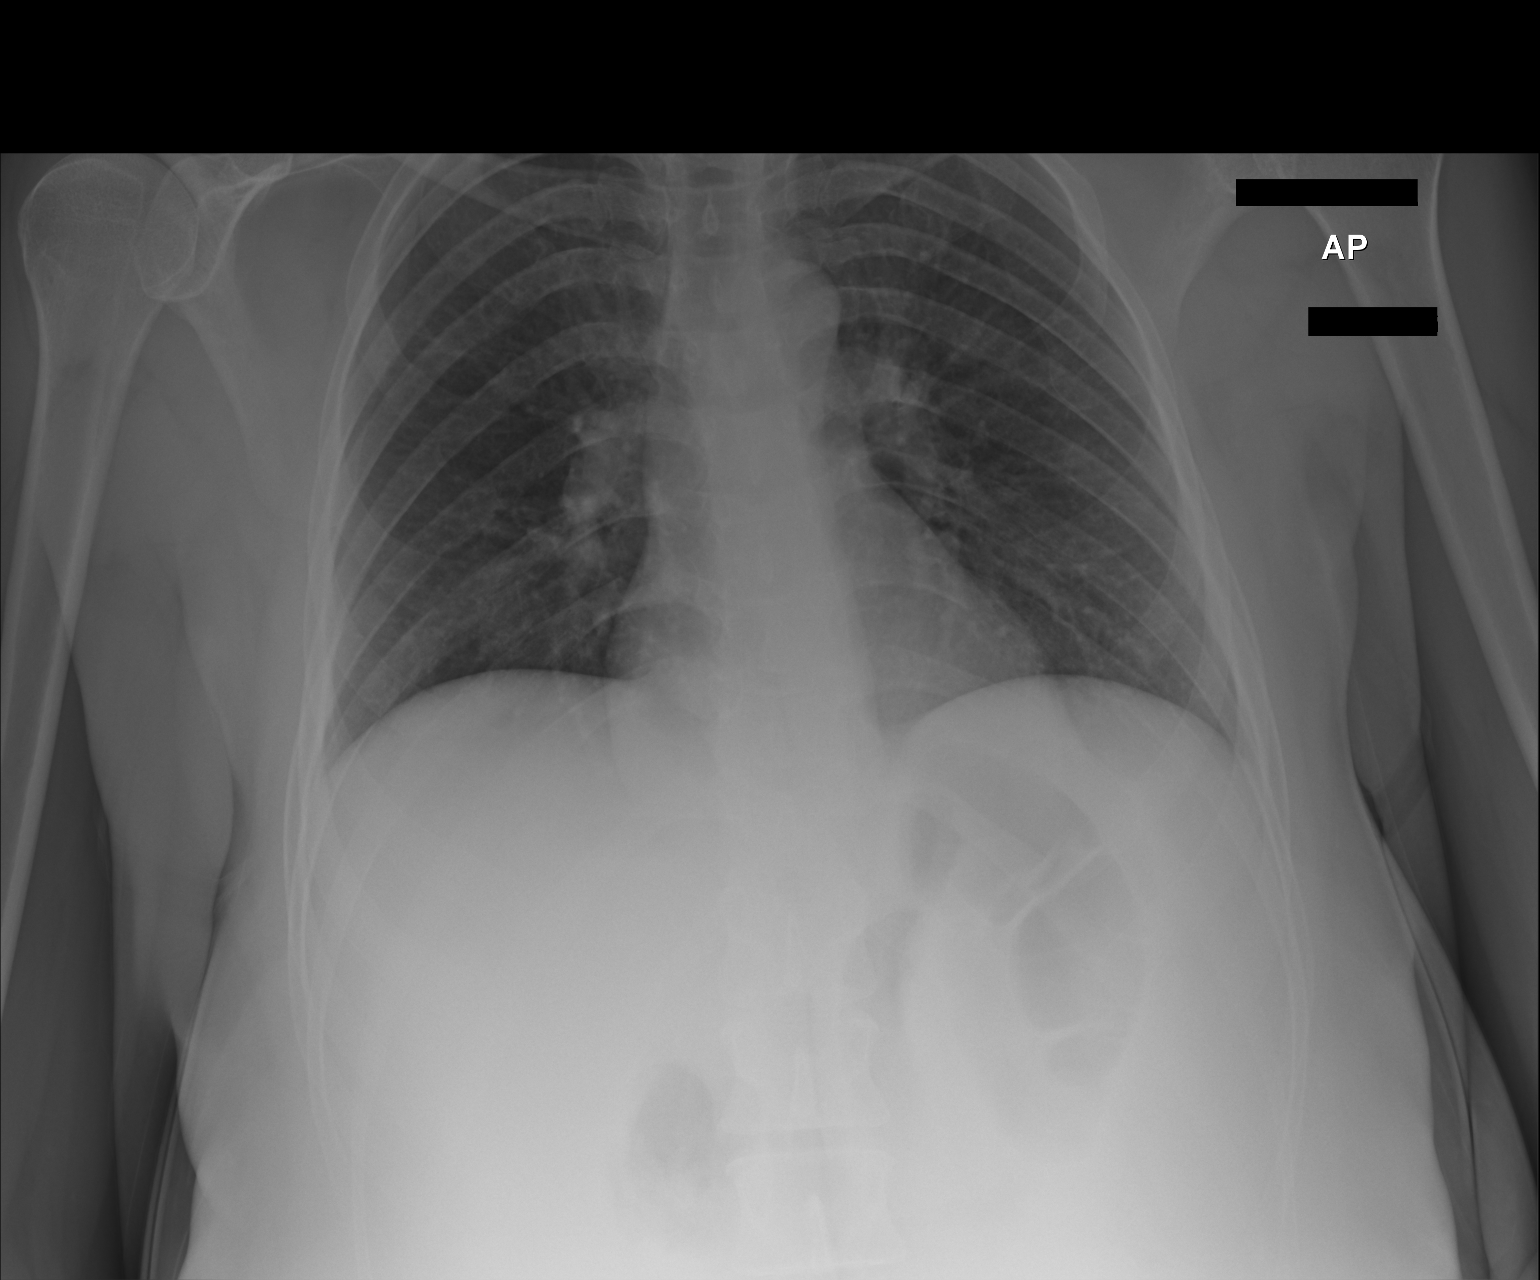

[1 of 1 positions shown; findings below may reference images not displayed]

FINDINGS: The heart size and mediastinal contours are within normal limits.
Both lungs are clear. The visualized skeletal structures are
unremarkable.
IMPRESSION: No active disease.

## 2022-01-11 ENCOUNTER — Emergency Department (HOSPITAL_COMMUNITY): Payer: Self-pay

## 2022-01-11 ENCOUNTER — Emergency Department (HOSPITAL_COMMUNITY): Payer: Medicaid Other

## 2022-01-11 ENCOUNTER — Emergency Department (HOSPITAL_COMMUNITY)
Admission: EM | Admit: 2022-01-11 | Discharge: 2022-01-11 | Disposition: A | Discharge status: Home / Self Care | Payer: Self-pay | Attending: Emergency Medicine | Admitting: Emergency Medicine

## 2022-01-11 ENCOUNTER — Encounter (HOSPITAL_COMMUNITY): Payer: Self-pay

## 2022-01-11 ENCOUNTER — Other Ambulatory Visit: Payer: Self-pay

## 2022-01-11 DIAGNOSIS — I728 Aneurysm of other specified arteries: Secondary | ICD-10-CM | POA: Insufficient documentation

## 2022-01-11 DIAGNOSIS — M79602 Pain in left arm: Secondary | ICD-10-CM | POA: Insufficient documentation

## 2022-01-11 DIAGNOSIS — R202 Paresthesia of skin: Secondary | ICD-10-CM | POA: Insufficient documentation

## 2022-01-11 DIAGNOSIS — R079 Chest pain, unspecified: Secondary | ICD-10-CM

## 2022-01-11 DIAGNOSIS — M79605 Pain in left leg: Secondary | ICD-10-CM | POA: Insufficient documentation

## 2022-01-11 LAB — CBC WITH DIFFERENTIAL/PLATELET
Abs Immature Granulocytes: 0.01 10*3/uL (ref 0.00–0.07)
Basophils Absolute: 0.1 10*3/uL (ref 0.0–0.1)
Basophils Relative: 1 %
Eosinophils Absolute: 0 10*3/uL (ref 0.0–0.5)
Eosinophils Relative: 0 %
HCT: 33.7 % — ABNORMAL LOW (ref 36.0–46.0)
Hemoglobin: 10.6 g/dL — ABNORMAL LOW (ref 12.0–15.0)
Immature Granulocytes: 0 %
Lymphocytes Relative: 25 %
Lymphs Abs: 1.4 10*3/uL (ref 0.7–4.0)
MCH: 25 pg — ABNORMAL LOW (ref 26.0–34.0)
MCHC: 31.5 g/dL (ref 30.0–36.0)
MCV: 79.5 fL — ABNORMAL LOW (ref 80.0–100.0)
Monocytes Absolute: 0.3 10*3/uL (ref 0.1–1.0)
Monocytes Relative: 6 %
Neutro Abs: 3.9 10*3/uL (ref 1.7–7.7)
Neutrophils Relative %: 68 %
Platelets: 102 10*3/uL — ABNORMAL LOW (ref 150–400)
RBC: 4.24 MIL/uL (ref 3.87–5.11)
RDW: 14.6 % (ref 11.5–15.5)
WBC: 5.7 10*3/uL (ref 4.0–10.5)
nRBC: 0 % (ref 0.0–0.2)

## 2022-01-11 LAB — COMPREHENSIVE METABOLIC PANEL
ALT: 18 U/L (ref 0–44)
AST: 18 U/L (ref 15–41)
Albumin: 3.6 g/dL (ref 3.5–5.0)
Alkaline Phosphatase: 41 U/L (ref 38–126)
Anion gap: 6 (ref 5–15)
BUN: 11 mg/dL (ref 6–20)
CO2: 26 mmol/L (ref 22–32)
Calcium: 8.8 mg/dL — ABNORMAL LOW (ref 8.9–10.3)
Chloride: 108 mmol/L (ref 98–111)
Creatinine, Ser: 0.68 mg/dL (ref 0.44–1.00)
GFR, Estimated: >60 mL/min (ref >60)
Glucose, Bld: 134 mg/dL — ABNORMAL HIGH (ref 70–99)
Potassium: 3.4 mmol/L — ABNORMAL LOW (ref 3.5–5.1)
Sodium: 140 mmol/L (ref 135–145)
Total Bilirubin: 0.2 mg/dL — ABNORMAL LOW (ref 0.3–1.2)
Total Protein: 6.8 g/dL (ref 6.5–8.1)

## 2022-01-11 LAB — TROPONIN I (HIGH SENSITIVITY)
Troponin I (High Sensitivity): <2 ng/L (ref <18)
Troponin I (High Sensitivity): <2 ng/L (ref <18)

## 2022-01-11 MED ORDER — MORPHINE SULFATE (PF) 4 MG/ML IV SOLN
4.0000 mg | Freq: Once | INTRAVENOUS | Status: AC
  Administered 2022-01-11: 4 mg via INTRAVENOUS
  Filled 2022-01-11: qty 1

## 2022-01-11 MED ORDER — ONDANSETRON HCL 4 MG/2ML IJ SOLN
4.0000 mg | Freq: Once | INTRAMUSCULAR | Status: AC
  Administered 2022-01-11: 4 mg via INTRAVENOUS
  Filled 2022-01-11: qty 2

## 2022-01-11 MED ORDER — POTASSIUM CHLORIDE CRYS ER 20 MEQ PO TBCR
40.0000 meq | EXTENDED_RELEASE_TABLET | Freq: Once | ORAL | Status: AC
  Administered 2022-01-11: 40 meq via ORAL
  Filled 2022-01-11: qty 2

## 2022-01-11 MED ORDER — IOHEXOL 350 MG/ML SOLN
80.0000 mL | Freq: Once | INTRAVENOUS | Status: AC | PRN
  Administered 2022-01-11: 80 mL via INTRAVENOUS

## 2022-01-11 NOTE — ED Triage Notes (Signed)
Bib ems from work, pt began having chest pain that radiates to the left arm, endorses left arm numbness and shob. Pt reports hx of "having hr so fast they had to shock me," and takes metoprolol and cardizem. Potential stent placement

## 2022-01-11 NOTE — Discharge Instructions (Signed)
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  I do not think this is the cause of your pain but it was a chance finding.  You will need to follow-up with the vascular surgeon to monitor this finding.  Please call their office to make an appointment within the next 1 to 2 weeks.  Return to the ER if you have recurrent chest pain or any additional concerns.

## 2022-01-11 NOTE — ED Notes (Signed)
Patient transported to CT 

## 2022-01-11 NOTE — ED Provider Notes (Signed)
[facility] EMERGENCY DEPARTMENT Provider Note   CSN: [id number] Arrival date & time: 01/11/22  1510     History {Add pertinent medical, surgical, social history, OB history to HPI:1} Chief Complaint  Patient presents with   Chest Pain    Ebony [name] is a 41 y.o. female.  Presents ER chief complaint of chest pain describes a left-sided chest pain that radiates to the left arm.  Symptoms ongoing since earlier this morning.  Patient was at rest when symptoms occurred.  Nothing seems to make it better or worse.  She says it has resulted in left arm pain and left arm numbness tingling sensation.  Also associate with left leg pain and tingling sensation.  She states that anytime she has chest pain she typically gets left arm and left leg pain as well.  She had similar symptoms before in the past.  She says work-up at that time was unremarkable.  She also has a history of rapid heart rate in the past oftentimes associated with her chest pain episodes she states.  Otherwise denies any fevers or cough or vomiting or diarrhea.       Home Medications Prior to Admission medications   Medication Sig Start Date End Date Taking? Authorizing Provider  diltiazem (CARDIZEM) 30 MG tablet TAKE 1 TABLET BY MOUTH DAILY AS NEEDED FOR FAST HEART RATE 06/19/21  Yes Camnitz, Andree Coss, MD  ibuprofen (ADVIL) 400 MG tablet Take 400 mg by mouth every 6 (six) hours as needed for moderate pain.   Yes [provider]  metoprolol succinate (TOPROL-XL) 50 MG 24 hr tablet Take 50 mg by mouth daily. Take with or immediately following a meal.   Yes [provider]  azithromycin (ZITHROMAX) 250 MG tablet Take 1 tablet (250 mg total) by mouth daily. Take first 2 tablets together, then 1 every day until finished. Patient not taking: Reported on 01/11/2022 06/02/21   Margarita Grizzle, MD      Allergies    Patient has no known allergies.    Review of Systems   Review of Systems   Constitutional:  Negative for fever.  HENT:  Negative for ear pain.   Eyes:  Negative for pain.  Respiratory:  Negative for cough.   Cardiovascular:  Positive for chest pain.  Gastrointestinal:  Negative for abdominal pain.  Genitourinary:  Negative for flank pain.  Musculoskeletal:  Negative for back pain.  Skin:  Negative for rash.  Neurological:  Negative for headaches.    Physical Exam Updated Vital Signs BP 106/60   Pulse (!) 59   Temp 98.4 F (36.9 C)   Resp 12   Ht 5\' 5"  (1.651 m)   Wt 80.3 kg   SpO2 100%   BMI 29.45 kg/m  Physical Exam Constitutional:      General: She is not in acute distress.    Appearance: Normal appearance.  HENT:     Head: Normocephalic.     Nose: Nose normal.  Eyes:     Extraocular Movements: Extraocular movements intact.  Cardiovascular:     Rate and Rhythm: Normal rate.  Pulmonary:     Effort: Pulmonary effort is normal.  Musculoskeletal:        General: Normal range of motion.     Cervical back: Normal range of motion.     Comments: Bilateral radial pulses are 2+ and intact.  Bilateral lower extremity dorsalis pedis pulses are 2+ and intact.  All extremities appear well-perfused compartments are soft the  patient is neurovascularly intact.  Neurological:     General: No focal deficit present.     Mental Status: She is alert. Mental status is at baseline.     ED Results / Procedures / Treatments   Labs (all labs ordered are listed, but only abnormal results are displayed) Labs Reviewed  CBC WITH DIFFERENTIAL/PLATELET - Abnormal; Notable for the following components:      Result Value   Hemoglobin 10.6 (*)    HCT 33.7 (*)    MCV 79.5 (*)    MCH 25.0 (*)    Platelets 102 (*)    All other components within normal limits  COMPREHENSIVE METABOLIC PANEL - Abnormal; Notable for the following components:   Potassium 3.4 (*)    Glucose, Bld 134 (*)    Calcium 8.8 (*)    Total Bilirubin 0.2 (*)    All other components within  normal limits  TROPONIN I (HIGH SENSITIVITY)  TROPONIN I (HIGH SENSITIVITY)    EKG EKG Interpretation  Date/Time:  Friday January 11 2022 15:26:47 EDT Ventricular Rate:  72 PR Interval:  138 QRS Duration: 82 QT Interval:  398 QTC Calculation: 436 R Axis:   53 Text Interpretation: Sinus rhythm Low voltage, precordial leads Abnormal R-wave progression, early transition Confirmed by Norman Clay ([id number]) on 01/11/2022 3:30:57 PM  Radiology CT Angio Chest Aorta W and/or Wo Contrast  Result Date: 01/11/2022 CLINICAL DATA:  Chest pain radiating to the left arm. Acute aortic syndrome (AAS) suspected EXAM: CT ANGIOGRAPHY CHEST WITH CONTRAST TECHNIQUE: Multidetector CT imaging of the chest was performed using the standard protocol during bolus administration of intravenous contrast. Multiplanar CT image reconstructions and MIPs were obtained to evaluate the vascular anatomy. RADIATION DOSE REDUCTION: This exam was performed according to the departmental dose-optimization program which includes automated exposure control, adjustment of the mA and/or kV according to patient size and/or use of iterative reconstruction technique. CONTRAST:  70mL OMNIPAQUE IOHEXOL 350 MG/ML SOLN COMPARISON:  Same day chest x-ray. FINDINGS: Cardiovascular: Preferential opacification of the thoracic aorta. No evidence of thoracic aortic aneurysm or dissection. Three vessel arch with widely patent branch vessels. Pulmonary arteries are also well opacified. No filling defect to suggest pulmonary embolism. Normal heart size. No pericardial effusion. Mediastinum/Nodes: Small amount of soft tissue density with interspersed fat in the anterior mediastinum compatible with residual thymic tissue. No enlarged mediastinal, hilar, or axillary lymph nodes. Thyroid gland, trachea, and esophagus demonstrate no significant findings. Lungs/Pleura: Lungs are clear. No pleural effusion or pneumothorax. 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 Otherwise negative. Musculoskeletal: No chest wall abnormality. No acute or significant osseous findings. Review of the MIP images confirms the above findings. IMPRESSION: 1. Negative for thoracic aortic aneurysm or dissection. 2. Lungs are clear. 3. 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 Recommend Interventional Radiology consultation. This recommendation follows ACR consensus guidelines: White Paper of the ACR Incidental Findings Committee II on Vascular Findings. J Am Coll Radiol [phone]. Electronically Signed   By: Duanne Guess D.O.   On: 01/11/2022 18:42   DG Chest Port 1 View  Result Date: 01/11/2022 CLINICAL DATA:  Chest pain radiating into the left arm. Left arm numbness and shortness of breath. EXAM: PORTABLE CHEST 1 VIEW COMPARISON:  Radiographs 05/26/2021 and 12/13/2020. FINDINGS: 1551 hours. The heart size and mediastinal contours are normal. The lungs are clear. There is no pleural effusion or pneumothorax. No acute  osseous findings are identified. Telemetry leads overlie the chest. IMPRESSION: Stable chest.  No evidence of active cardiopulmonary process. Electronically Signed   By: Carey Bullocks M.D.   On: 01/11/2022 15:58    Procedures Procedures  {Document cardiac monitor, telemetry assessment procedure when appropriate:1}  Medications Ordered in ED Medications  potassium chloride SA (KLOR-CON M) CR tablet 40 mEq (has no administration in time range)  morphine (PF) 4 MG/ML injection 4 mg (4 mg Intravenous Given 01/11/22 1607)  ondansetron (ZOFRAN) injection 4 mg (4 mg Intravenous Given 01/11/22 1607)  iohexol (OMNIPAQUE) 350 MG/ML injection 80 mL (80 mLs Intravenous Contrast Given 01/11/22 1833)    ED Course/ Medical Decision Making/ A&P                           Medical Decision Making Amount and/or Complexity of Data Reviewed Labs: ordered. Radiology: ordered.  Risk Prescription  drug management.   Review of record shows visit in March 2023 for strep throat.  History from family bedside.  Cardiac monitoring showing sinus rhythm.  Diagnosis studies include labs.  Troponins are flat x2.  Potassium slightly low at 3.4 and given oral replacement here.  Hemoglobin mildly anemic at 10 white count is normal.  CT angio of the chest pursued with no evidence of aortic disease or pulmonary embolism.  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  Patient instructed to follow-up with vascular surgery within 1 or 2 weeks.  Advised immediate return for worsening symptoms or any additional concerns.  {Document critical care time when appropriate:1} {Document review of labs and clinical decision tools ie heart score, Chads2Vasc2 etc:1}  {Document your independent review of radiology images, and any outside records:1} {Document your discussion with family members, caretakers, and with consultants:1} {Document social determinants of health affecting pt's care:1} {Document your decision making why or why not admission, treatments were needed:1} Final Clinical Impression(s) / ED Diagnoses Final diagnoses:  None    Rx / DC Orders ED Discharge Orders     None

## 2022-01-17 ENCOUNTER — Telehealth: Payer: Self-pay | Admitting: Surgery

## 2022-01-17 NOTE — Telephone Encounter (Signed)
-----   Message from Nada Libman, MD sent at 01/12/2022 12:39 AM EDT ----- Please schedule the patient to see any MD as a new patient for evaluation of by incidental finding of a 2 cm splenic artery aneurysm.  No studies are required

## 2022-03-19 NOTE — Telephone Encounter (Signed)
Left VM to Call us to schedule appointment

## 2022-03-26 NOTE — Telephone Encounter (Signed)
Spoke with PT she will callback tomorrow to schedule appt

## 2022-04-24 ENCOUNTER — Other Ambulatory Visit: Payer: Self-pay | Admitting: Student

## 2022-08-13 IMAGING — DX DG CHEST 2V
2 series · 2 of 2 positions shown · non-contrast
Comparison: May 01, 2020.

CLINICAL DATA: Chest pain.

EXAM:
CHEST - 2 VIEW

[chest pa]
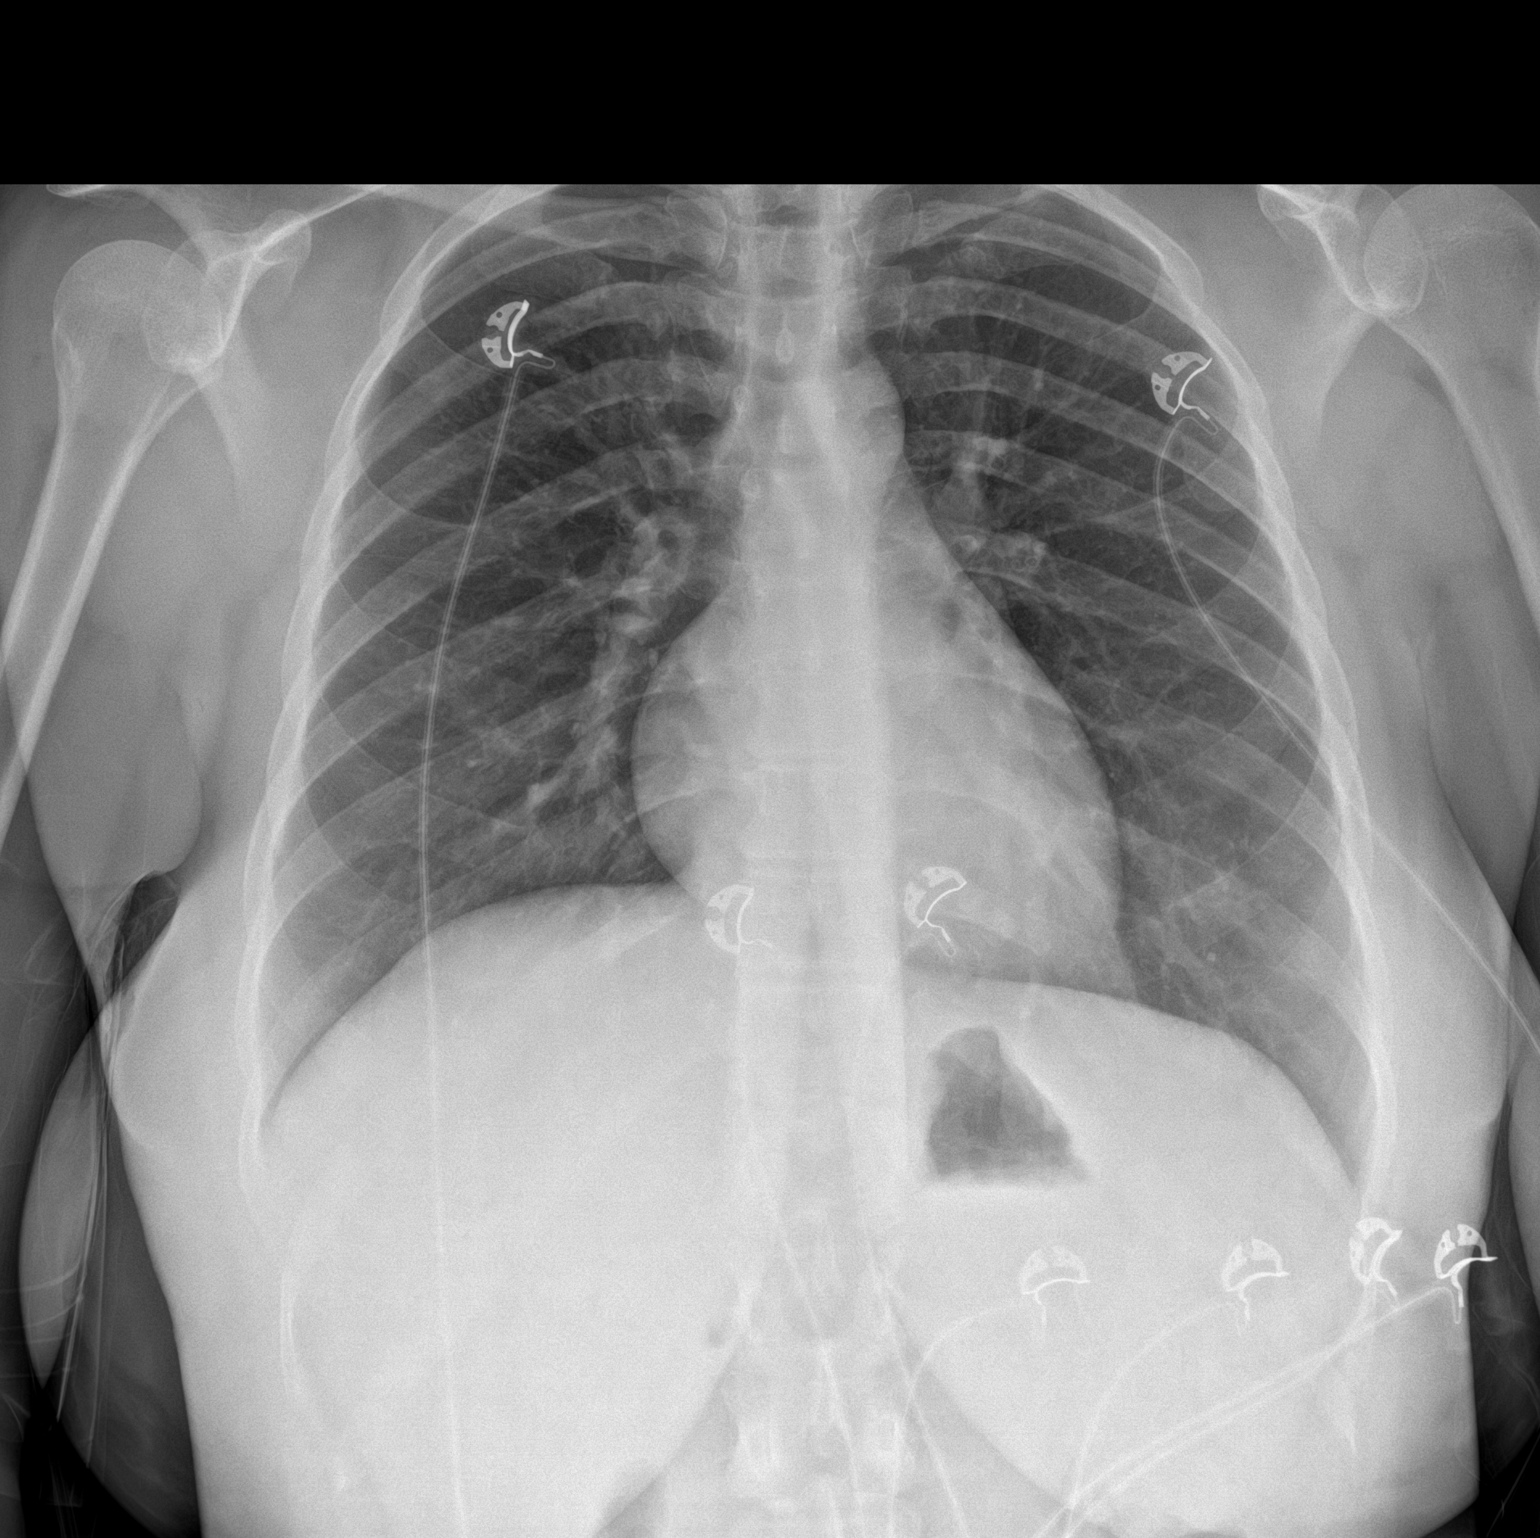

[chest lat]
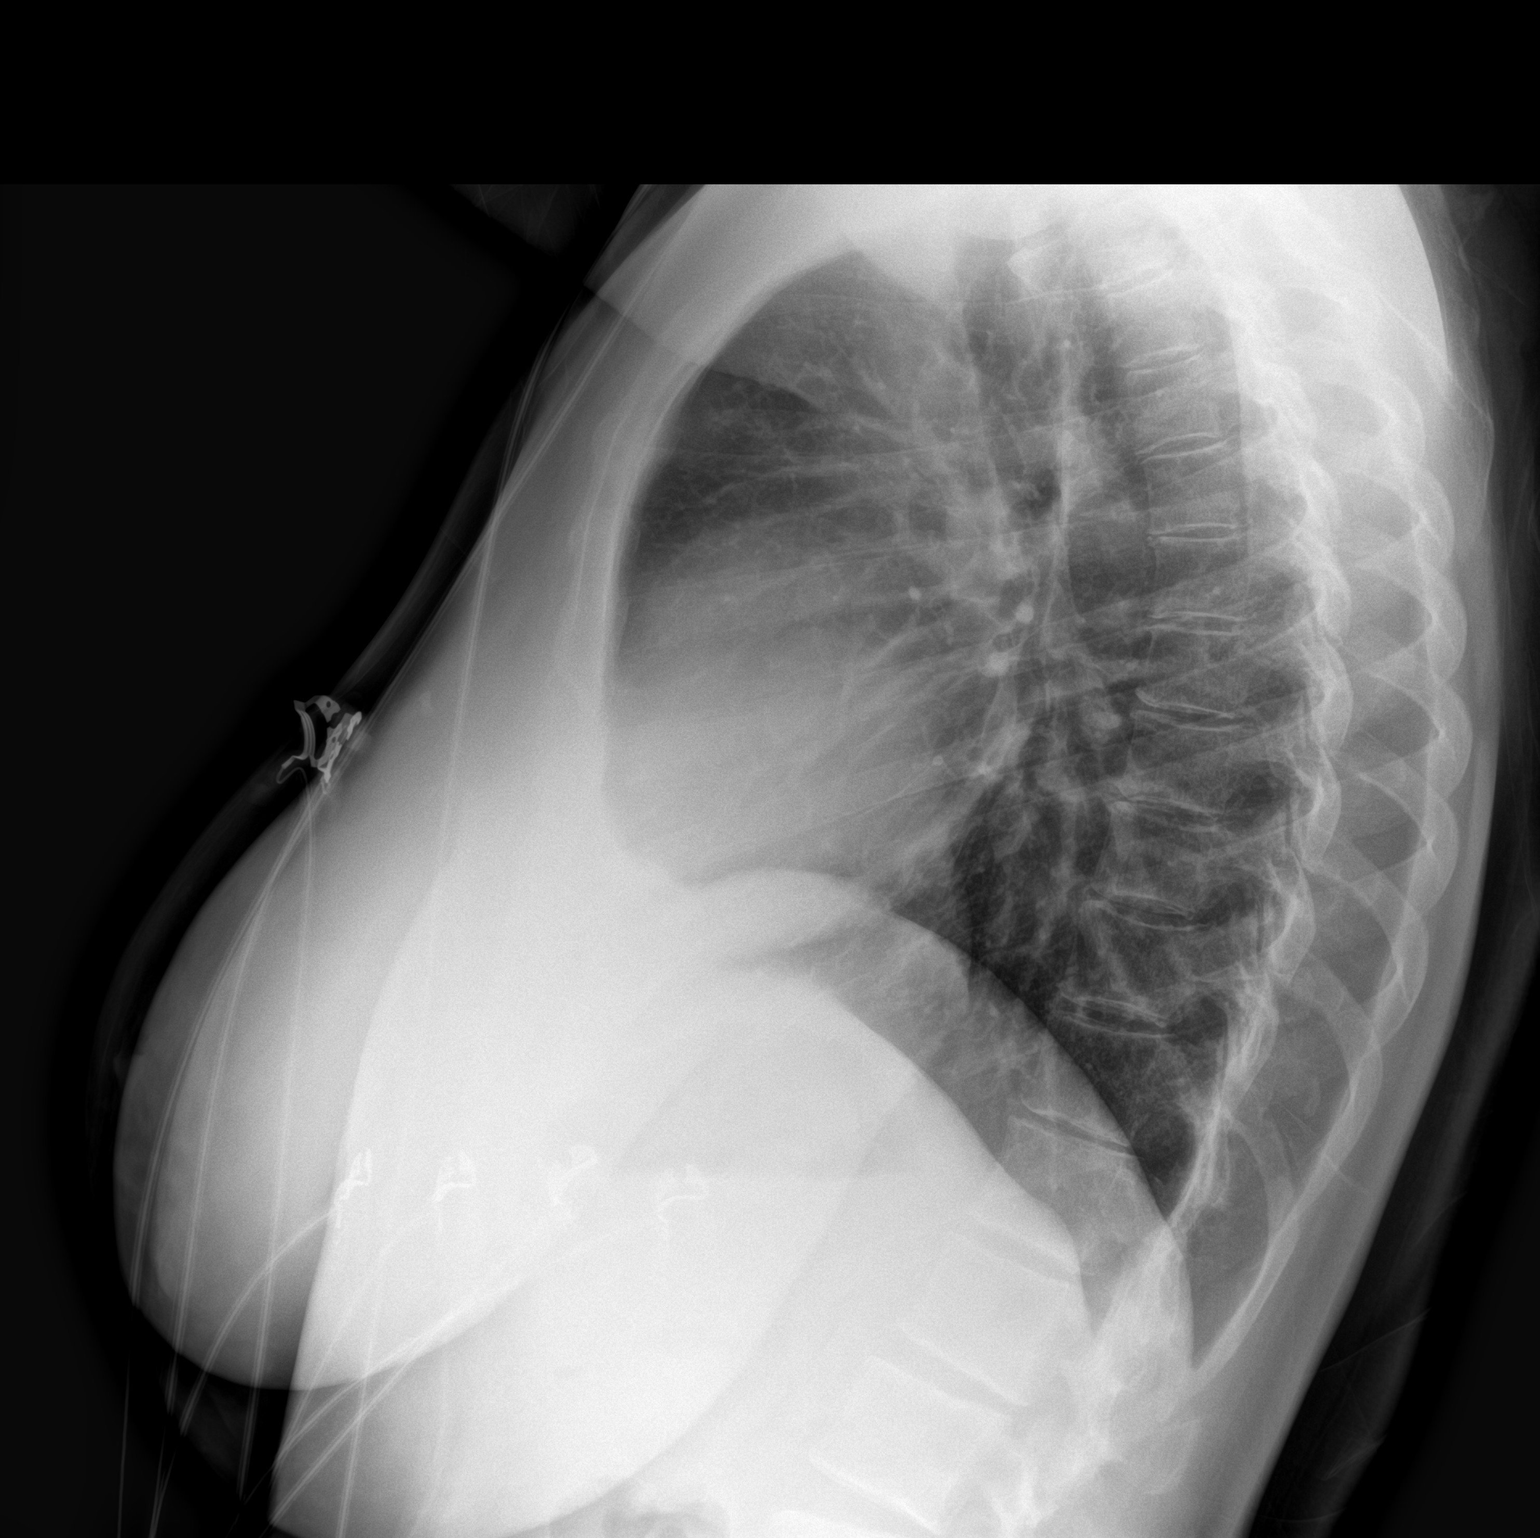

[2 of 2 positions shown; findings below may reference images not displayed]

FINDINGS: The heart size and mediastinal contours are within normal limits.
Both lungs are clear. No visible pleural effusions or pneumothorax.
No acute osseous abnormality.
IMPRESSION: No evidence of acute cardiopulmonary disease.
